# Patient Record
Sex: Female | Born: 1985 | Race: White | Hispanic: No | Marital: Married | Smoking: Never smoker
Health system: Southern US, Community
[De-identification: ages and names within clinical notes are randomized; demographics above are authoritative.]

## PROBLEM LIST (undated history)

## (undated) DIAGNOSIS — O24419 Gestational diabetes mellitus in pregnancy, unspecified control: Secondary | ICD-10-CM

## (undated) DIAGNOSIS — K219 Gastro-esophageal reflux disease without esophagitis: Secondary | ICD-10-CM

## (undated) HISTORY — PX: NO PAST SURGERIES: SHX2092

## (undated) HISTORY — DX: Gestational diabetes mellitus in pregnancy, unspecified control: O24.419

## (undated) HISTORY — DX: Gastro-esophageal reflux disease without esophagitis: K21.9

---

## 2007-03-03 ENCOUNTER — Emergency Department: Payer: Self-pay | Admitting: Emergency Medicine

## 2008-11-13 ENCOUNTER — Ambulatory Visit: Payer: Self-pay | Admitting: Family Medicine

## 2010-12-29 IMAGING — CT CT NECK WITH CONTRAST
2 series · 10 of 14 positions shown, 12 images · IV contrast (agent unspecified)
Comparison: none

REASON FOR EXAM: Left neck mass
CALL REPORT [id number]
COMMENTS:

PROCEDURE:     HIRCZE - HIRCZE NECK WITH CONTRAST  - November 13, 2008  [DATE]
RESULT:     The patient has a history of a mass.
TECHNIQUE: IV contrast enhanced neck CT is obtained.

[Series 2: soft tissue · axial · 0.43mm/px · z∈[+521,+716]mm · 8 of 85 slices shown, 10 images]
[im 10/85  soft-tissue]
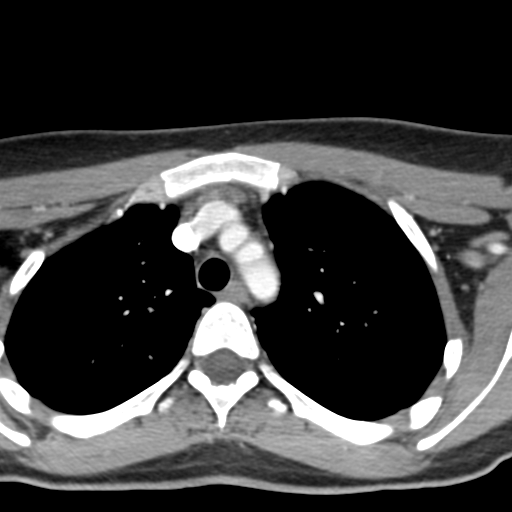
[im 10/85  bone]
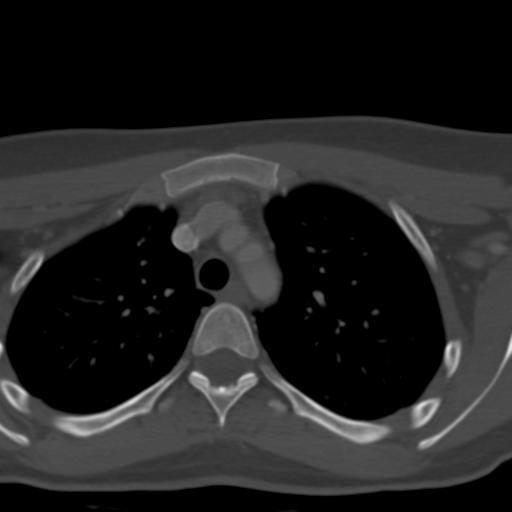
[im 19/85  bone]
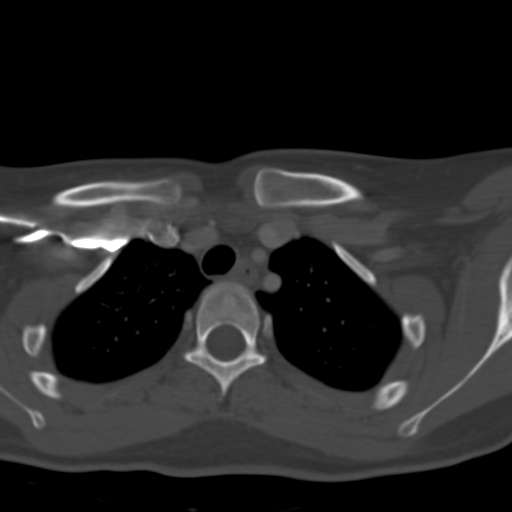
[im 29/85  bone]
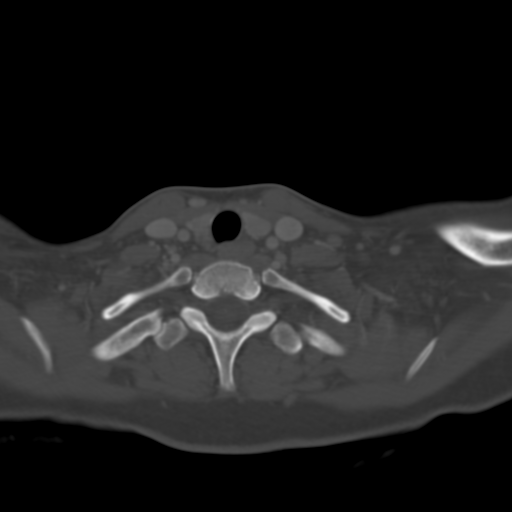
[im 38/85  bone]
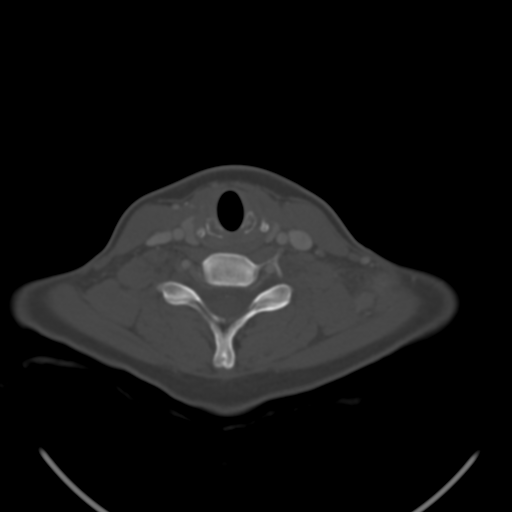
[im 47/85  soft-tissue]
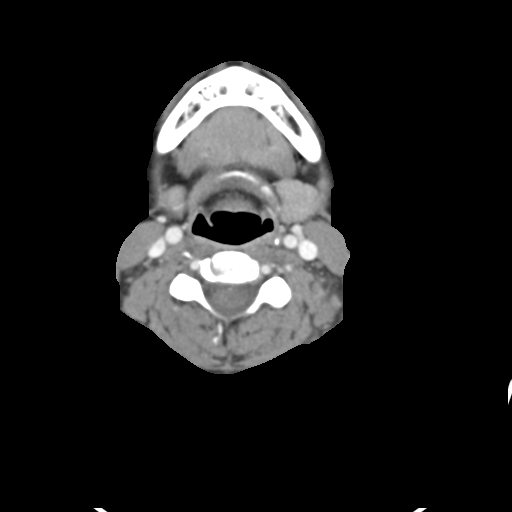
[im 47/85  bone]
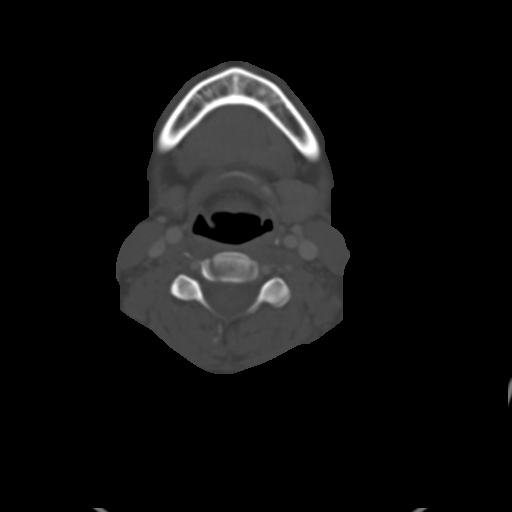
[im 57/85  bone]
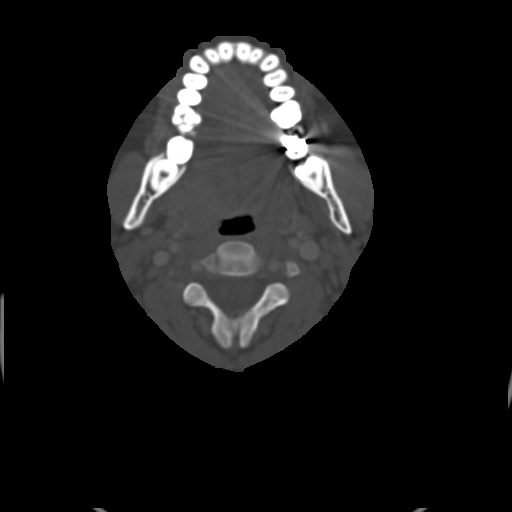
[im 66/85  bone]
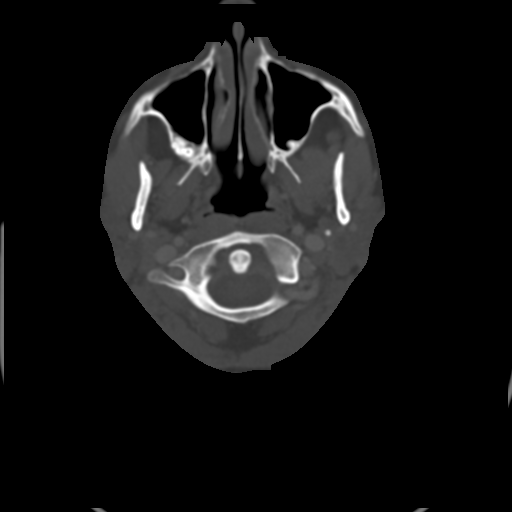
[im 75/85  bone]
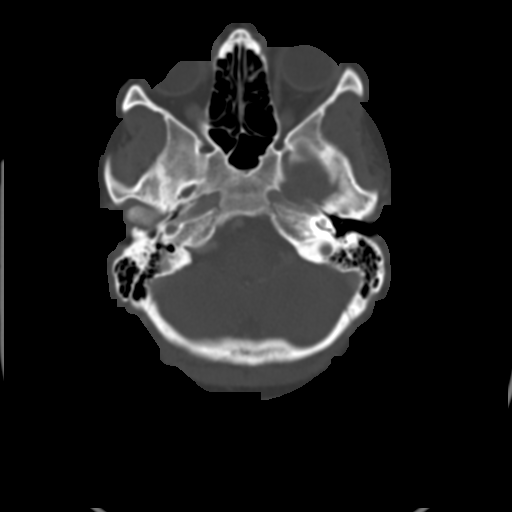

[Series 3: lung · axial · 0.43mm/px · z∈[+530,+569]mm · 2 of 39 slices shown]
[im 13/39  bone]
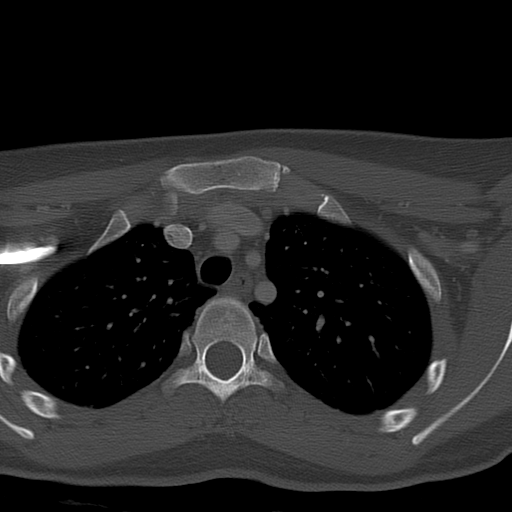
[im 26/39  bone]
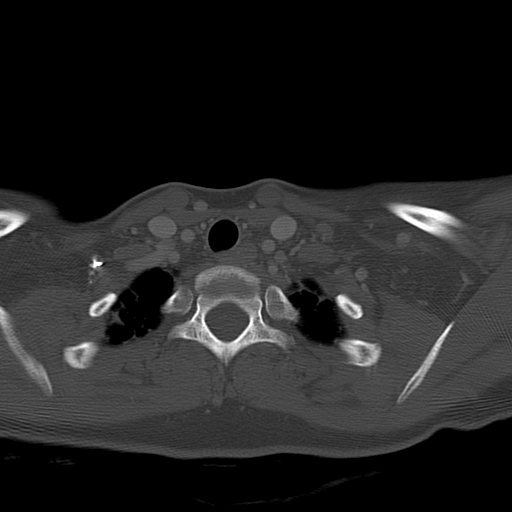

[10 of 14 positions shown; findings below may reference images not displayed]

FINDINGS: The tongue base is normal. The submandibular glands and parotid
glands are normal. The larynx and parapharyngeal spaces are normal. The
thyroid is normal. Noted in the posterior neck and subclavicular region are
enhancing nodular densities that measure up to 1.7 cm. At least three
lesions are noted. Lucency is noted within the central one. These could be
necrotic and enhancing lymph nodes and could represent inflammatory,
infectious or malignant lymph nodes. This could also represent a primary
malignancy. Adjacent shotty cervical lymphadenopathy is present.
Lymphadenopathy is also noted of the right jugular chain. Has this patient
had a cat scratch as cat scratch disease could present in this fashion?
IMPRESSION: Enhancing left posterior neck/supraclavicular lesion as
described above with differential diagnosis as above.

This report was phoned to the patient's physician at the time of the study.

## 2011-10-20 ENCOUNTER — Inpatient Hospital Stay: Payer: Self-pay | Admitting: Obstetrics and Gynecology

## 2014-02-12 ENCOUNTER — Inpatient Hospital Stay: Payer: Self-pay

## 2014-02-12 LAB — CBC WITH DIFFERENTIAL/PLATELET
Basophil #: 0.1 10*3/uL (ref 0.0–0.1)
Basophil %: 0.5 %
EOS PCT: 1.6 %
Eosinophil #: 0.2 10*3/uL (ref 0.0–0.7)
HCT: 33.3 % — AB (ref 35.0–47.0)
HGB: 10.9 g/dL — ABNORMAL LOW (ref 12.0–16.0)
LYMPHS ABS: 1.8 10*3/uL (ref 1.0–3.6)
Lymphocyte %: 14.5 %
MCH: 28.3 pg (ref 26.0–34.0)
MCHC: 32.6 g/dL (ref 32.0–36.0)
MCV: 87 fL (ref 80–100)
MONO ABS: 0.9 x10 3/mm (ref 0.2–0.9)
MONOS PCT: 7.6 %
Neutrophil #: 9.2 10*3/uL — ABNORMAL HIGH (ref 1.4–6.5)
Neutrophil %: 75.8 %
Platelet: 222 10*3/uL (ref 150–440)
RBC: 3.84 10*6/uL (ref 3.80–5.20)
RDW: 14.3 % (ref 11.5–14.5)
WBC: 12.1 10*3/uL — AB (ref 3.6–11.0)

## 2014-02-13 LAB — HEMATOCRIT: HCT: 32.1 % — AB (ref 35.0–47.0)

## 2015-03-16 NOTE — H&P (Signed)
L&D Evaluation:  History Expanded:  HPI 29 yo G2P1001 at 15w6dgestational age presents for elective Induction of labor.  Pregnancy uncomplicated.  She notes occasional contractions.  She denies leakage of fluid and vaginal bleeding.  She has noted good positive fetal movement.   Gravida 2   Term 1   PreTerm 0   Abortion 0   Living 1   Blood Type (Maternal) O positive   Group B Strep Results Maternal (Result >5wks must be treated as unknown) positive   Maternal HIV Negative   Maternal Syphilis Ab Nonreactive   Maternal Varicella Immune   Rubella Results (Maternal) nonimmune   [facility]Apr[facility]   Patient's Medical History No Chronic Illness   Patient's Surgical History none   Medications Pre Natal Vitamins   Allergies PCN, rash as a child   Social History none   Family History Non-Contributory   ROS:  ROS All systems were reviewed.  HEENT, CNS, GI, GU, Respiratory, CV, Renal and Musculoskeletal systems were found to be normal., unless noted in HPI   Exam:  Vital Signs afebrile, normotensive, all other vitals normal   General no apparent distress   Mental Status clear   Chest clear   Heart normal sinus rhythm   Abdomen gravid, non-tender   Estimated Fetal Weight 8.5 pounds   Fetal Position cephalic   Back no CVAT   Edema no edema   Pelvic no external lesions, 4/70/-2   Mebranes Intact   FHT normal rate with no decels   FHT Description 135/mod var/+accels/no decels   Ucx irregular, 3-4 q 10 min   Skin no lesions   Lymph no lymphadenopathy   Impression:  Impression 1) Intrauterine pregnancy at 458w6d2) Elective Induction of labor   Plan:  Comments 1) Labor: AROM now that is s/p Ancef 4 hours ago.  Clear fluid  2) Fetus - category I tracing  3) PNL O positive / ABSC negative / RNI - MMR postpartum / VZI / HIV neg / RPR NR / HBsAg neg / genetic screen declined / 1-hr OGTT 110 / GBS positive - clinda resistant. Did well with Ancef  last pregnancy. Will use again.   4) TDAP given 11/28/13, flu given 08/29/13  5) Disposition - home postpartum   Labs:  Lab Results: Routine Hem:  09-Apr-15 09:00   WBC (CBC)  12.1  RBC (CBC) 3.84  Hemoglobin (CBC)  10.9  Hematocrit (CBC)  33.3  Platelet Count (CBC) 222  MCV 87  MCH 28.3  MCHC 32.6  RDW 14.3  Neutrophil % 75.8  Lymphocyte % 14.5  Monocyte % 7.6  Eosinophil % 1.6  Basophil % 0.5  Neutrophil #  9.2  Lymphocyte # 1.8  Monocyte # 0.9  Eosinophil # 0.2  Basophil # 0.1 (Result(s) reported on 12 Feb 2014 at 09:44AM.)   Electronic Signatures: JaWill BonnetMD)  (Signed 09-Apr-15 12:40)  Authored: L&D Evaluation, Labs   Last Updated: 09-Apr-15 12:40 by JaWill BonnetMD)

## 2016-12-01 ENCOUNTER — Ambulatory Visit (INDEPENDENT_AMBULATORY_CARE_PROVIDER_SITE_OTHER): Payer: BLUE CROSS/BLUE SHIELD | Admitting: Family Medicine

## 2016-12-01 ENCOUNTER — Ambulatory Visit: Payer: Self-pay | Admitting: Family Medicine

## 2016-12-01 ENCOUNTER — Encounter: Payer: Self-pay | Admitting: Family Medicine

## 2016-12-01 VITALS — BP 118/82 | HR 87 | Temp 98.3°F | Ht 64.5 in | Wt 150.0 lb

## 2016-12-01 DIAGNOSIS — Z0000 Encounter for general adult medical examination without abnormal findings: Secondary | ICD-10-CM | POA: Diagnosis not present

## 2016-12-01 LAB — LIPID PANEL
Cholesterol: 168 mg/dL (ref 0–200)
HDL: 44.5 mg/dL (ref >39.00)
LDL CALC: 99 mg/dL (ref 0–99)
NONHDL: 123.5
TRIGLYCERIDES: 124 mg/dL (ref 0.0–149.0)
Total CHOL/HDL Ratio: 4
VLDL: 24.8 mg/dL (ref 0.0–40.0)

## 2016-12-01 LAB — COMPREHENSIVE METABOLIC PANEL WITH GFR
ALT: 16 U/L (ref 0–35)
AST: 19 U/L (ref 0–37)
Albumin: 4.4 g/dL (ref 3.5–5.2)
Alkaline Phosphatase: 97 U/L (ref 39–117)
BUN: 8 mg/dL (ref 6–23)
CO2: 32 meq/L (ref 19–32)
Calcium: 9.5 mg/dL (ref 8.4–10.5)
Chloride: 103 meq/L (ref 96–112)
Creatinine, Ser: 0.65 mg/dL (ref 0.40–1.20)
GFR: 111.81 mL/min
Glucose, Bld: 81 mg/dL (ref 70–99)
Potassium: 4.3 meq/L (ref 3.5–5.1)
Sodium: 140 meq/L (ref 135–145)
Total Bilirubin: 0.3 mg/dL (ref 0.2–1.2)
Total Protein: 7.3 g/dL (ref 6.0–8.3)

## 2016-12-01 LAB — CBC
HCT: 39.7 % (ref 36.0–46.0)
Hemoglobin: 13.4 g/dL (ref 12.0–15.0)
MCHC: 33.8 g/dL (ref 30.0–36.0)
MCV: 88.9 fl (ref 78.0–100.0)
Platelets: 305 10*3/uL (ref 150.0–400.0)
RBC: 4.47 Mil/uL (ref 3.87–5.11)
RDW: 12.8 % (ref 11.5–15.5)
WBC: 6.6 10*3/uL (ref 4.0–10.5)

## 2016-12-01 LAB — TSH: TSH: 2.02 u[IU]/mL (ref 0.35–4.50)

## 2016-12-01 NOTE — Patient Instructions (Signed)

## 2016-12-01 NOTE — Progress Notes (Signed)
Pre visit review using our clinic review tool, if applicable. No additional management support is needed unless otherwise documented below in the visit note. 

## 2016-12-01 NOTE — Progress Notes (Signed)
   Subjective:  Patient ID: Sonya Oconnor, female    DOB: 05/04/1986  Age: 31 y.o. MRN: [id number]  CC: Annual exam, concern for hypothyroidism  HPI Sonya Oconnor is a 31 y.o. female presents to the clinic today for an annual exam. She has a concern about underlying hypothyroidism.  Preventative Healthcare  Pap smear: Up to date.  Immunizations  Tetanus - Up to date.   Flu - Up to date.   Labs: Requesting labs today.  Exercise: No regular exercise.   Alcohol use: No.  Smoking/tobacco use: No.  STD/HIV testing: Has had screening.  Regular dental exams: Yes.   Wears seat belt: yes.   PMH, Surgical Hx, Family Hx, Social History reviewed and updated as below.  Past Medical History:  Diagnosis Date  . GERD (gastroesophageal reflux disease)    Past Surgical History:  Procedure Laterality Date  . NO PAST SURGERIES     Family History  Problem Relation Age of Onset  . Hypertension Father   . Mental illness Father   . Colon cancer Maternal Grandmother   . Thyroid disease Mother   . Thyroid disease Paternal Grandmother    Social History  Substance Use Topics  . Smoking status: Never Smoker  . Smokeless tobacco: Never Used  . Alcohol use No    Review of Systems  Constitutional: Positive for fatigue.  Psychiatric/Behavioral:       Sadness, anxiety, stress.  All other systems reviewed and are negative.  Objective:   Today's Vitals: BP 118/82   Pulse 87   Temp 98.3 F (36.8 C) (Oral)   Ht 5' 4.5" (1.638 m)   Wt 150 lb (68 kg)   SpO2 99%   BMI 25.35 kg/m   Physical Exam  Constitutional: She is oriented to person, place, and time. She appears well-developed and well-nourished. No distress.  HENT:  Head: Normocephalic and atraumatic.  Nose: Nose normal.  Mouth/Throat: Oropharynx is clear and moist. No oropharyngeal exudate.  Normal TM's bilaterally.   Eyes: Conjunctivae are normal. No scleral icterus.  Neck: Neck supple.  Cardiovascular:  Normal rate and regular rhythm.   No murmur heard. Pulmonary/Chest: Effort normal and breath sounds normal. She has no wheezes. She has no rales.  Abdominal: Soft. She exhibits no distension. There is no tenderness. There is no rebound and no guarding.  Musculoskeletal: Normal range of motion. She exhibits no edema.  Lymphadenopathy:    She has no cervical adenopathy.  Neurological: She is alert and oriented to person, place, and time.  Skin: Skin is warm and dry. No rash noted.  Psychiatric: She has a normal mood and affect.  Vitals reviewed.  Assessment & Plan:   Problem List Items Addressed This Visit    Annual physical exam - Primary    Preventative health care up to date.  Concerned about hypothyroidism given fatigue and family history. Labs today.      Relevant Orders   CBC   Comprehensive metabolic panel   Lipid panel   TSH     Follow-up: Return in about 1 year (around 12/01/2017).  Everlene OtherJayce Shanyiah Conde DO [facility]    ]

## 2016-12-01 NOTE — Assessment & Plan Note (Addendum)
Preventative health care up to date.  Concerned about hypothyroidism given fatigue and family history. Labs today.

## 2019-11-21 ENCOUNTER — Ambulatory Visit (INDEPENDENT_AMBULATORY_CARE_PROVIDER_SITE_OTHER): Payer: Self-pay | Admitting: Obstetrics and Gynecology

## 2019-11-21 ENCOUNTER — Encounter: Payer: Self-pay | Admitting: Obstetrics and Gynecology

## 2019-11-21 ENCOUNTER — Other Ambulatory Visit: Payer: Self-pay

## 2019-11-21 VITALS — BP 118/74 | Ht 64.0 in | Wt 151.0 lb

## 2019-11-21 DIAGNOSIS — Z30011 Encounter for initial prescription of contraceptive pills: Secondary | ICD-10-CM

## 2019-11-21 DIAGNOSIS — Z30432 Encounter for removal of intrauterine contraceptive device: Secondary | ICD-10-CM

## 2019-11-21 MED ORDER — NORGESTIMATE-ETH ESTRADIOL 0.25-35 MG-MCG PO TABS: 1.0000 | ORAL_TABLET | Freq: Every day | ORAL | 4 refills | Status: DC

## 2019-11-21 NOTE — Progress Notes (Signed)
    IUD Removal  Patient identified, informed consent performed, consent signed.  Patient was in the dorsal lithotomy position, normal external genitalia was noted.  A speculum was placed in the patient's vagina, normal discharge was noted, no lesions. The cervix was visualized, no lesions, no abnormal discharge.  The strings of the IUD were grasped and pulled using ring forceps. The IUD was removed in its entirety. Patient tolerated the procedure well.    Patient will use combined OCPs for contraception.  She has no contraindications to estrogen. She does not smoke, she has no history of VTE, she does not smoke.  She has no liver issues. Routine preventative health maintenance measures emphasized.  Thomasene Mohair, MD, [facility], [facility] 11/21/2019 4:03 PM

## 2019-12-05 ENCOUNTER — Ambulatory Visit: Payer: Self-pay | Admitting: Obstetrics and Gynecology

## 2021-07-19 ENCOUNTER — Telehealth: Payer: Self-pay

## 2021-07-19 NOTE — Telephone Encounter (Signed)
Pt's hsb, Sonya Oconnor, calling; pt is having trouble eating and keeping food down.  [phone]  Adv vitamin B6 10-24mg  q8h, unisom 25mg  at HS and 12.5mg  in am; sea bands, nausea suckers, lemon ginger drops; try to eat something every 3h if only a saltine cracker; to be seen if doesn't keep any fluids down for 24hrs.

## 2021-08-04 ENCOUNTER — Encounter: Payer: BLUE CROSS/BLUE SHIELD | Admitting: Obstetrics and Gynecology

## 2021-08-12 ENCOUNTER — Ambulatory Visit (INDEPENDENT_AMBULATORY_CARE_PROVIDER_SITE_OTHER): Payer: Self-pay | Admitting: Advanced Practice Midwife

## 2021-08-12 ENCOUNTER — Other Ambulatory Visit (HOSPITAL_COMMUNITY)
Admission: RE | Admit: 2021-08-12 | Discharge: 2021-08-12 | Disposition: A | Discharge status: Home / Self Care | Payer: Medicaid Other | Source: Ambulatory Visit | Attending: Obstetrics and Gynecology | Admitting: Obstetrics and Gynecology

## 2021-08-12 ENCOUNTER — Other Ambulatory Visit: Payer: Self-pay

## 2021-08-12 ENCOUNTER — Encounter: Payer: Self-pay | Admitting: Advanced Practice Midwife

## 2021-08-12 VITALS — BP 120/80 | Wt 155.0 lb

## 2021-08-12 DIAGNOSIS — Z369 Encounter for antenatal screening, unspecified: Secondary | ICD-10-CM | POA: Insufficient documentation

## 2021-08-12 DIAGNOSIS — Z3687 Encounter for antenatal screening for uncertain dates: Secondary | ICD-10-CM | POA: Insufficient documentation

## 2021-08-12 DIAGNOSIS — Z3A00 Weeks of gestation of pregnancy not specified: Secondary | ICD-10-CM | POA: Diagnosis not present

## 2021-08-12 DIAGNOSIS — Z124 Encounter for screening for malignant neoplasm of cervix: Secondary | ICD-10-CM | POA: Insufficient documentation

## 2021-08-12 DIAGNOSIS — Z3481 Encounter for supervision of other normal pregnancy, first trimester: Secondary | ICD-10-CM | POA: Insufficient documentation

## 2021-08-12 DIAGNOSIS — Z3A12 12 weeks gestation of pregnancy: Secondary | ICD-10-CM

## 2021-08-12 DIAGNOSIS — Z3480 Encounter for supervision of other normal pregnancy, unspecified trimester: Secondary | ICD-10-CM

## 2021-08-12 DIAGNOSIS — Z113 Encounter for screening for infections with a predominantly sexual mode of transmission: Secondary | ICD-10-CM | POA: Insufficient documentation

## 2021-08-12 DIAGNOSIS — Z1159 Encounter for screening for other viral diseases: Secondary | ICD-10-CM

## 2021-08-12 LAB — POCT URINALYSIS DIPSTICK OB
Glucose, UA: NEGATIVE
POC,PROTEIN,UA: NEGATIVE

## 2021-08-12 LAB — POCT URINE PREGNANCY: Preg Test, Ur: POSITIVE — AB

## 2021-08-12 NOTE — Patient Instructions (Signed)

## 2021-08-13 ENCOUNTER — Encounter: Payer: Self-pay | Admitting: Advanced Practice Midwife

## 2021-08-13 DIAGNOSIS — O09529 Supervision of elderly multigravida, unspecified trimester: Secondary | ICD-10-CM | POA: Insufficient documentation

## 2021-08-13 NOTE — Progress Notes (Signed)
New Obstetric Patient H&P  Date of Service: 08/12/2021  Chief Complaint: "Desires prenatal care"   History of Present Illness: Patient is a 35 y.o. G9M2111 Not Hispanic or Latino female, presents with amenorrhea and positive home pregnancy test. Patient's last menstrual period was 05/17/2021. and based on her  LMP, her EDD is Estimated Date of Delivery: 02/21/22 and her EGA is [redacted]w[redacted]d. Cycles are 4 days, regular, and occur approximately every : 28 days. Her last pap smear was 7 years ago and was no abnormalities.    She had a urine pregnancy test which was positive 6 week(s)  ago. Her last menstrual period was normal and lasted for  4 day(s). Since her LMP she claims she has experienced breast tenderness, fatigue, nausea. She denies vaginal bleeding. Her past medical history is noncontributory. Her prior pregnancies are notable for  2 FT SVDs both males, 2012, 2015, 8#14oz and 8#15oz. 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  Since her LMP, she admits to the use of tobacco products  no She claims she has gained no pounds since the start of her pregnancy.  There are cats in the home in the home  no  She admits close contact with children on a regular basis  yes  She has had chicken pox in the past yes She has had Tuberculosis exposures, symptoms, or previously tested positive for TB   no Current or past history of domestic violence. no  Genetic Screening/Teratology Counseling: (Includes patient, baby's father, or anyone in either family with:)   1. Patient's age >/= 45 at [facility]  yes 2. Thalassemia ([place], [place], Mediterranean, or Asian background): MCV<80  no 3. Neural tube defect (meningomyelocele, spina bifida, anencephaly)  no 4. Congenital heart defect  no  5. Down syndrome  no 6. Tay-Sachs (Jewish, [place]  no 7. Canavan's Disease  no 8. Sickle cell disease or trait (African)  no  9. Hemophilia or other blood disorders  no  10. Muscular dystrophy  no  11. Cystic fibrosis  no  12. Huntington's  Chorea  no  13. Mental retardation/autism  no 14. Other inherited genetic or chromosomal disorder  no 15. Maternal metabolic disorder (DM, PKU, etc)  no 16. 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 Patient or FOB with a birth defect themselves FOB born with cleft palate 17. Recurrent pregnancy loss, or stillbirth  no  18. Any medications since LMP other than prenatal vitamins (include vitamins, supplements, OTC meds, drugs, alcohol)  no 19. Any other genetic/environmental exposure to discuss  no  Infection History:   1. Lives with someone with TB or TB exposed  no  2. Patient or partner has history of genital herpes  no 3. Rash or viral illness since LMP  no 4. History of STI (GC, CT, HPV, syphilis, HIV)  no 5. History of recent travel :  no  Other pertinent information:  no    Review of Systems:10 point review of systems negative unless otherwise noted in HPI  Past Medical History:  Patient Active Problem List   Diagnosis Date Noted   Advanced maternal age in multigravida 08/13/2021   Supervision of other normal pregnancy, antepartum 08/12/2021     Nursing Staff Provider  Office Location  [facility] Dating    Language  English Anatomy US    Flu Vaccine   Genetic Screen  NIPS:   TDaP vaccine    Hgb A1C or  GTT Early :  NA Third trimester :   Covid    LAB RESULTS   Rhogam   Blood Type     Feeding Plan Breast Antibody    Contraception  Rubella    Circumcision  RPR     Pediatrician   HBsAg     Support Person Husband John HIV    Prenatal Classes  Varicella     GBS  (For PCN allergy, check sensitivities)   BTL Consent     VBAC Consent  Pap  08/12/2021    Hgb Electro    Pelvis Tested  CF      SMA        Husband; hx cleft palate Son; hx cleft uvula     Past Surgical History:  Past Surgical History:  Procedure Laterality Date   NO PAST SURGERIES      Gynecologic History: Patient's last menstrual period was  05/17/2021.  Obstetric History: [id number]  Family History:  Family History  Problem Relation Age of Onset   Hypertension Father    Mental illness Father    Colon cancer Maternal Grandmother    Thyroid disease Mother    Thyroid disease Paternal Grandmother     Social History:  Social History   Socioeconomic History   Marital status: Married    Spouse name: Not on file   Number of children: Not on file   Years of education: Not on file   Highest education level: Not on file  Occupational History   Not on file  Tobacco Use   Smoking status: Never   Smokeless tobacco: Never  Substance and Sexual Activity   Alcohol use: No   Drug use: No   Sexual activity: Yes    Partners: Male    Birth control/protection: I.U.D.  Other Topics Concern   Not on file  Social History Narrative   Not on file   Social Determinants of Health   Financial Resource Strain: Not on file  Food Insecurity: Not on file  Transportation Needs: Not on file  Physical Activity: Not on file  Stress: Not on file  Social Connections: Not on file  Intimate Partner Violence: Not on file    Allergies:  Allergies  Allergen Reactions   Penicillins     Medications: Prior to Admission medications   Not on File    Physical Exam Vitals: Blood pressure 120/80, weight 155 lb (70.3 kg), last menstrual period 05/17/2021.  General: NAD HEENT: normocephalic, anicteric Thyroid: no enlargement, no palpable nodules Pulmonary: No increased work of breathing, CTAB Cardiovascular: RRR, distal pulses 2+ Abdomen: NABS, soft, non-tender, non-distended.  Umbilicus without lesions.  No hepatomegaly, splenomegaly or masses palpable. No evidence of hernia. FHTs 160s Genitourinary:  External: Normal external female genitalia.  Normal urethral meatus, normal  Bartholin's and Skene's glands.    Vagina: Normal vaginal mucosa, no evidence of prolapse.    Cervix: Grossly normal in appearance, no bleeding, no CMT  Uterus:   Non-enlarged, mobile, normal contour.    Adnexa: ovaries non-enlarged, no adnexal masses  Rectal: deferred Extremities: no edema, erythema, or tenderness Neurologic: Grossly intact Psychiatric: mood appropriate, affect full   The following were addressed during this visit:  Breastfeeding Education - Early initiation of breastfeeding    Comments: Keeps milk supply adequate, helps contract uterus and slow bleeding, and early milk is the perfect first food and is easy to digest.   - The importance of exclusive breastfeeding    Comments: Provides antibodies, Lower risk of breast and ovarian cancers,  and type-2 diabetes,Helps your body recover, Reduced chance of SIDS.   - Risks of giving your baby anything other than breast milk if you are breastfeeding    Comments: Make the baby less content with breastfeeds, may make my baby more susceptible to illness, and may reduce my milk supply.   - The importance of early skin-to-skin contact    Comments:  Keeps baby warm and secure, helps keep baby's blood sugar up and breathing steady, easier to bond and breastfeed, and helps calm baby.  - Rooming-in on a 24-hour basis    Comments: Easier to learn baby's feeding cues, easier to bond and get to know each other, and encourages milk production.   - Feeding on demand or baby-led feeding    Comments: Helps prevent breastfeeding complications, helps bring in good milk supply, prevents under or overfeeding, and helps baby feel content and satisfied   - Frequent feeding to help assure optimal milk production    Comments: Making a full supply of milk requires frequent removal of milk from breasts, infant will eat 8-12 times in 24 hours, if separated from infant use breast massage, hand expression and/ or pumping to remove milk from breasts.   - Effective positioning and attachment    Comments: Helps my baby to get enough breast milk, helps to produce an adequate milk supply, and helps prevent  nipple pain and damage   - Exclusive breastfeeding for the first 6 months    Comments: Builds a healthy milk supply and keeps it up, protects baby from sickness and disease, and breastmilk has everything your baby needs for the first 6 months.   Assessment: 35 y.o. G3P2002 at [redacted]w[redacted]d presenting to initiate prenatal care  Plan: 1) Avoid alcoholic beverages. 2) Patient encouraged not to smoke.  3) Discontinue the use of all non-medicinal drugs and chemicals.  4) Take prenatal vitamins daily.  5) Nutrition, food safety (fish, cheese advisories, and high nitrite foods) and exercise discussed. 6) Hospital and practice style discussed with cross coverage system.  7) Genetic Screening, such as with 1st Trimester Screening, cell free fetal DNA, AFP testing, and Ultrasound, as well as with amniocentesis and CVS as appropriate, is discussed with patient. At the conclusion of today's visit patient requested genetic testing 8) Patient is asked about travel to areas at risk for the Zika virus, and counseled to avoid travel and exposure to mosquitoes or sexual partners who may have themselves been exposed to the virus. Testing is discussed, and will be ordered as appropriate.  9) PAPtima, urine culture today 10) Return to clinic in 1 week for dating scan and rob 40) Labs including MaterniT 21 when [employer] policy is updated; other NOB labs future ordered   [name], CNM [facility] 08/13/2021, 4:58 PM

## 2021-08-15 LAB — URINE CULTURE: Organism ID, Bacteria: NO GROWTH

## 2021-08-17 LAB — CYTOLOGY - PAP
Chlamydia: NEGATIVE
Comment: NEGATIVE
Comment: NEGATIVE
Comment: NEGATIVE
Comment: NORMAL
Diagnosis: NEGATIVE
High risk HPV: NEGATIVE
Neisseria Gonorrhea: NEGATIVE
Trichomonas: NEGATIVE

## 2021-08-23 ENCOUNTER — Encounter: Payer: Self-pay | Admitting: Obstetrics & Gynecology

## 2021-08-30 ENCOUNTER — Ambulatory Visit (INDEPENDENT_AMBULATORY_CARE_PROVIDER_SITE_OTHER): Payer: Self-pay | Admitting: Obstetrics and Gynecology

## 2021-08-30 ENCOUNTER — Other Ambulatory Visit: Payer: Self-pay

## 2021-08-30 VITALS — BP 116/76 | Wt 156.0 lb

## 2021-08-30 DIAGNOSIS — Z363 Encounter for antenatal screening for malformations: Secondary | ICD-10-CM

## 2021-08-30 DIAGNOSIS — Z3689 Encounter for other specified antenatal screening: Secondary | ICD-10-CM

## 2021-08-30 DIAGNOSIS — Z3480 Encounter for supervision of other normal pregnancy, unspecified trimester: Secondary | ICD-10-CM

## 2021-08-30 DIAGNOSIS — O09521 Supervision of elderly multigravida, first trimester: Secondary | ICD-10-CM

## 2021-08-30 LAB — POCT URINALYSIS DIPSTICK OB
Glucose, UA: NEGATIVE
POC,PROTEIN,UA: NEGATIVE

## 2021-08-30 NOTE — Progress Notes (Signed)
    Routine Prenatal Care Visit  Subjective  Sonya Oconnor is a 35 y.o. G3P2002 at [redacted]w[redacted]d being seen today for ongoing prenatal care.  She is currently monitored for the following issues for this high-risk pregnancy and has Supervision of other normal pregnancy, antepartum and Advanced maternal age in multigravida on their problem list.  ----------------------------------------------------------------------------------- Patient reports no complaints.   Contractions: Not present. Vag. Bleeding: None.  Movement: Absent. Denies leaking of fluid.  ----------------------------------------------------------------------------------- The following portions of the patient's history were reviewed and updated as appropriate: allergies, current medications, past family history, past medical history, past social history, past surgical history and problem list. Problem list updated.   Objective  Last menstrual period 05/17/2021. Pregravid weight 155 lb (70.3 kg) Total Weight Gain 1 lb (0.454 kg) Urinalysis:      Fetal Status: Fetal Heart Rate (bpm): 145   Movement: Absent     General:  Alert, oriented and cooperative. Patient is in no acute distress.  Skin: Skin is warm and dry. No rash noted.   Cardiovascular: Normal heart rate noted  Respiratory: Normal respiratory effort, no problems with respiration noted  Abdomen: Soft, gravid, appropriate for gestational age. Pain/Pressure: Absent     Pelvic:  Cervical exam deferred        Extremities: Normal range of motion.     ental Status: Normal mood and affect. Normal behavior. Normal judgment and thought content.     Assessment   35 y.o. P8E4235 at [redacted]w[redacted]d by  02/21/2022, by Last Menstrual Period presenting for routine prenatal visit  Plan   pregnancy3  Problems (from 08/12/21 to present)     Problem Noted Resolved   Advanced maternal age in multigravida 08/13/2021 by [name], CNM No   Supervision of other normal pregnancy, antepartum  08/12/2021 by [name], CNM No   Overview Addendum 08/13/2021  4:53 PM by [name], CNM     Nursing Staff Provider  Office Location  [facility] Dating    Language  English Anatomy US    Flu Vaccine   Genetic Screen  NIPS:   TDaP vaccine    Hgb A1C or  GTT Early : NA Third trimester :   Covid    LAB RESULTS   Rhogam   Blood Type     Feeding Plan Breast Antibody    Contraception  Rubella    Circumcision  RPR     Pediatrician   HBsAg     Support Person Husband John HIV    Prenatal Classes  Varicella     GBS  (For PCN allergy, check sensitivities)   BTL Consent     VBAC Consent  Pap  08/12/2021    Hgb Electro    Pelvis Tested  CF      SMA        Husband; hx cleft palate Son; hx cleft uvula           Gestational age appropriate obstetric precautions including but not limited to vaginal bleeding, contractions, leaking of fluid and fetal movement were reviewed in detail with the patient.    - Dating scan today S=D - anatomy scan ordered - will wait on labs until next month once maternity coverage active  Return in about 4 weeks (around 09/27/2021), or ROB, for ROB.  Vena Austria, MD, Evern Core [facility], [facility] 08/30/2021, 1:31 PM

## 2021-08-30 NOTE — Progress Notes (Signed)
ROB - dating scan, no concerns.US 1 

## 2021-08-30 NOTE — Progress Notes (Signed)
Dating scan  Singleton viable IUP with CRL of [redacted]w[redacted]d or 8.34cm ( 8.18cm, 8.35cm, and 8.50cm), FL 1.40cm c.w [redacted]w[redacted]d.  This given an ultrasound EDD of 02/27/2022 which is consistent with LMP dating of 02/21/2022.  FHT 148BPM YS not visualized ROV normal LOV normal Placenta posterior No evidence of uterine fibroids, no free fluid.  There is a viable singleton gestation.  The fetal biometry correlates with established dating. Detailed evaluation of the fetal anatomy is precluded by early gestational age.  It must be noted that a normal ultrasound particular at this early gestational age is unable to rule out fetal aneuploidy, risk of first trimester miscarriage, or anatomic birth defects.  Vena [place], MD, Evern Core [facility], [facility] 08/30/2021, 1:30 PM

## 2021-09-27 ENCOUNTER — Encounter: Payer: Self-pay | Admitting: Obstetrics

## 2021-10-03 ENCOUNTER — Ambulatory Visit: Payer: Self-pay

## 2021-10-03 ENCOUNTER — Ambulatory Visit
Admission: RE | Admit: 2021-10-03 | Discharge: 2021-10-03 | Disposition: A | Discharge status: Home / Self Care | Payer: Medicaid Other | Source: Ambulatory Visit | Attending: Obstetrics and Gynecology | Admitting: Obstetrics and Gynecology

## 2021-10-03 ENCOUNTER — Other Ambulatory Visit: Payer: Self-pay

## 2021-10-03 DIAGNOSIS — O09522 Supervision of elderly multigravida, second trimester: Secondary | ICD-10-CM | POA: Insufficient documentation

## 2021-10-03 DIAGNOSIS — O09521 Supervision of elderly multigravida, first trimester: Secondary | ICD-10-CM

## 2021-10-03 DIAGNOSIS — Z3480 Encounter for supervision of other normal pregnancy, unspecified trimester: Secondary | ICD-10-CM

## 2021-10-03 DIAGNOSIS — Z363 Encounter for antenatal screening for malformations: Secondary | ICD-10-CM | POA: Diagnosis not present

## 2021-10-03 DIAGNOSIS — Z3A19 19 weeks gestation of pregnancy: Secondary | ICD-10-CM | POA: Insufficient documentation

## 2021-10-05 ENCOUNTER — Encounter: Payer: Self-pay | Admitting: Obstetrics

## 2021-10-05 ENCOUNTER — Other Ambulatory Visit: Payer: Self-pay

## 2021-10-05 ENCOUNTER — Ambulatory Visit (INDEPENDENT_AMBULATORY_CARE_PROVIDER_SITE_OTHER): Payer: Self-pay | Admitting: Obstetrics

## 2021-10-05 VITALS — BP 118/70 | Ht 64.0 in | Wt 161.6 lb

## 2021-10-05 DIAGNOSIS — Z3480 Encounter for supervision of other normal pregnancy, unspecified trimester: Secondary | ICD-10-CM

## 2021-10-05 DIAGNOSIS — Z369 Encounter for antenatal screening, unspecified: Secondary | ICD-10-CM

## 2021-10-05 DIAGNOSIS — Z1159 Encounter for screening for other viral diseases: Secondary | ICD-10-CM

## 2021-10-05 DIAGNOSIS — Z3A20 20 weeks gestation of pregnancy: Secondary | ICD-10-CM

## 2021-10-05 DIAGNOSIS — Z3481 Encounter for supervision of other normal pregnancy, first trimester: Secondary | ICD-10-CM

## 2021-10-05 DIAGNOSIS — Z113 Encounter for screening for infections with a predominantly sexual mode of transmission: Secondary | ICD-10-CM

## 2021-10-05 NOTE — Progress Notes (Signed)
  Routine Prenatal Care Visit  Subjective  Sonya Oconnor is a 35 y.o. G3P2002 at [redacted]w[redacted]d being seen today for ongoing prenatal care.  She is currently monitored for the following issues for this low-risk pregnancy and has Supervision of other normal pregnancy, antepartum and Advanced maternal age in multigravida on their problem list.  ----------------------------------------------------------------------------------- Patient reports no complaints.  She now has insurance, and will have her prenatal labs drawn today. She is having a boy. Contractions: Not present. Vag. Bleeding: None.  Movement: Present. Leaking Fluid denies.  ----------------------------------------------------------------------------------- The following portions of the patient's history were reviewed and updated as appropriate: allergies, current medications, past family history, past medical history, past social history, past surgical history and problem list. Problem list updated.  Objective  Blood pressure 118/70, height 5\' 4"  (1.626 m), weight 161 lb 9.6 oz (73.3 kg), last menstrual period 05/17/2021. Pregravid weight 155 lb (70.3 kg) Total Weight Gain 6 lb 9.6 oz (2.994 kg) Urinalysis: Urine Protein    Urine Glucose    Fetal Status:     Movement: Present     General:  Alert, oriented and cooperative. Patient is in no acute distress.  Skin: Skin is warm and dry. No rash noted.   Cardiovascular: Normal heart rate noted  Respiratory: Normal respiratory effort, no problems with respiration noted  Abdomen: Soft, gravid, appropriate for gestational age. Pain/Pressure: Absent     Pelvic:  Cervical exam deferred        Extremities: Normal range of motion.     Mental Status: Normal mood and affect. Normal behavior. Normal judgment and thought content.   Assessment   35 y.o. G3P2002 at [redacted]w[redacted]d by  02/21/2022, by Last Menstrual Period presenting for routine prenatal visit  Plan   pregnancy3  Problems (from 08/12/21 to  present)    Problem Noted Resolved   Advanced maternal age in multigravida 08/13/2021 by 10/13/2021, CNM No   Supervision of other normal pregnancy, antepartum 08/12/2021 by 10/12/2021, CNM No   Overview Addendum 08/13/2021  4:53 PM by 10/13/2021, CNM     Nursing Staff Provider  Office Location  [facility] Dating    Language  English Anatomy [name]    Flu Vaccine   Genetic Screen  NIPS:   TDaP vaccine    Hgb A1C or  GTT Early : NA Third trimester :   Covid    LAB RESULTS   Rhogam   Blood Type     Feeding Plan Breast Antibody    Contraception  Rubella    Circumcision  RPR     Pediatrician   HBsAg     Support Person Husband John HIV    Prenatal Classes  Varicella     GBS  (For PCN allergy, check sensitivities)   BTL Consent     VBAC Consent  Pap  08/12/2021    Hgb Electro    Pelvis Tested  CF      SMA         Husband; hx cleft palate Son; hx cleft uvula          Preterm labor symptoms and general obstetric precautions including but not limited.    Return in about 2 weeks (around 10/19/2021).  A repeat scan is ordered to better view the spine. 10/21/2021, CNM  10/05/2021 4:14 PM

## 2021-11-06 NOTE — L&D Delivery Note (Addendum)
Delivery Note ? ?First Stage: ?Labor onset: 0630 ?Augmentation : AROM and pitocin ?Analgesia /Anesthesia intrapartum: epidural ?AROM at 0626 ? ?Second Stage: ?Complete dilation at 1000 ?Onset of pushing at 1006 ?FHR second stage Cat II, recurrent variable decels ? ?Delivery of a viable female infant 02/23/2022 at 1011 by Donato Schultz, CNM ?delivery of fetal head in OA position with restitution to LOT. ?No nuchal cord;  Anterior then posterior shoulders delivered easily with gentle downward traction. Baby placed on mom's chest, and attended to by peds.  ?Cord double clamped after cessation of pulsation, cut by FOB ?Cord blood sample collected  ? ?Third Stage: ?Placenta delivered Tomasa Blase intact with 3 VC @ 1019 ?Placenta disposition: discarded ?Uterine tone firm / bleeding light ? ?no laceration identified  ?Anesthesia for repair: n/a ?Repair none needed ?Est. Blood Loss (mL): 250 ? ?Complications: none ? ?Mom to postpartum.  Baby to Couplet care / Skin to Skin. ? ?Newborn: ?Birth Weight: 8lb 9oz ?Apgar Scores: 8, 9 ?Feeding planned: breast ? ? ? ?

## 2021-11-24 DIAGNOSIS — Z3482 Encounter for supervision of other normal pregnancy, second trimester: Secondary | ICD-10-CM | POA: Diagnosis not present

## 2021-11-24 DIAGNOSIS — O09523 Supervision of elderly multigravida, third trimester: Secondary | ICD-10-CM | POA: Insufficient documentation

## 2021-11-24 DIAGNOSIS — O09522 Supervision of elderly multigravida, second trimester: Secondary | ICD-10-CM | POA: Diagnosis not present

## 2021-12-02 DIAGNOSIS — Z3482 Encounter for supervision of other normal pregnancy, second trimester: Secondary | ICD-10-CM | POA: Diagnosis not present

## 2021-12-02 LAB — OB RESULTS CONSOLE HIV ANTIBODY (ROUTINE TESTING): HIV: NONREACTIVE

## 2021-12-02 LAB — OB RESULTS CONSOLE GC/CHLAMYDIA: Gonorrhea: NEGATIVE

## 2021-12-02 LAB — OB RESULTS CONSOLE VARICELLA ZOSTER ANTIBODY, IGG: Varicella: IMMUNE

## 2021-12-06 ENCOUNTER — Other Ambulatory Visit: Payer: Self-pay | Admitting: Certified Nurse Midwife

## 2021-12-07 ENCOUNTER — Ambulatory Visit: Payer: Self-pay

## 2021-12-07 DIAGNOSIS — Z419 Encounter for procedure for purposes other than remedying health state, unspecified: Secondary | ICD-10-CM | POA: Diagnosis not present

## 2021-12-08 DIAGNOSIS — Z0374 Encounter for suspected problem with fetal growth ruled out: Secondary | ICD-10-CM | POA: Diagnosis not present

## 2021-12-09 DIAGNOSIS — R7309 Other abnormal glucose: Secondary | ICD-10-CM | POA: Diagnosis not present

## 2021-12-15 ENCOUNTER — Other Ambulatory Visit: Payer: Self-pay

## 2021-12-15 ENCOUNTER — Encounter: Payer: 59 | Attending: Certified Nurse Midwife | Admitting: *Deleted

## 2021-12-15 ENCOUNTER — Encounter: Payer: Self-pay | Admitting: *Deleted

## 2021-12-15 VITALS — BP 106/68 | Ht 63.0 in | Wt 166.8 lb

## 2021-12-15 DIAGNOSIS — O09523 Supervision of elderly multigravida, third trimester: Secondary | ICD-10-CM | POA: Diagnosis not present

## 2021-12-15 DIAGNOSIS — Z3A29 29 weeks gestation of pregnancy: Secondary | ICD-10-CM | POA: Insufficient documentation

## 2021-12-15 DIAGNOSIS — O24419 Gestational diabetes mellitus in pregnancy, unspecified control: Secondary | ICD-10-CM | POA: Insufficient documentation

## 2021-12-15 DIAGNOSIS — O24410 Gestational diabetes mellitus in pregnancy, diet controlled: Secondary | ICD-10-CM

## 2021-12-15 NOTE — Progress Notes (Signed)
Diabetes Self-Management Education  Visit Type: First/Initial  Appt. Start Time: 0830 Appt. End Time: 1000  12/15/2021  [name], identified by name and date of birth, is a 36 y.o. female with a diagnosis of Diabetes: Gestational Diabetes.   ASSESSMENT  Blood pressure 106/68, height 5\' 3"  (1.6 m), weight 166 lb 12.8 oz (75.7 kg), last menstrual period 05/17/2021, estimated date of delivery 02/21/2022 Body mass index is 29.55 kg/m.   Diabetes Self-Management Education - 12/15/21 0929       Visit Information   Visit Type First/Initial      Initial Visit   Diabetes Type Gestational Diabetes    Are you currently following a meal plan? No    Are you taking your medications as prescribed? Yes    Date Diagnosed "few days'      Health Coping   How would you rate your overall health? Good      Psychosocial Assessment   Patient Belief/Attitude about Diabetes Other (comment)   "a little worried"   Self-care barriers None    Self-management support Doctor's office;Family    Patient Concerns Nutrition/Meal planning;Healthy Lifestyle;Glycemic Control    Special Needs None    Preferred Learning Style Hands on;Visual    Learning Readiness Ready    How often do you need to have someone help you when you read instructions, pamphlets, or other written materials from your doctor or pharmacy? 1 - Never    What is the last grade level you completed in school? college      Pre-Education Assessment   Patient understands the diabetes disease and treatment process. Needs Instruction    Patient understands incorporating nutritional management into lifestyle. Needs Instruction    Patient undertands incorporating physical activity into lifestyle. Needs Instruction    Patient understands using medications safely. Needs Instruction    Patient understands monitoring blood glucose, interpreting and using results Needs Instruction    Patient understands prevention, detection, and treatment of  acute complications. Needs Instruction    Patient understands prevention, detection, and treatment of chronic complications. Needs Instruction    Patient understands how to develop strategies to address psychosocial issues. Needs Instruction    Patient understands how to develop strategies to promote health/change behavior. Needs Instruction      Complications   How often do you check your blood sugar? 0 times/day (not testing)   Provided Accu-Chek Guide Me meter and instructed on use. BG upon return demonstration was 119 mg/dL at 9:50 am - 2 hrs pp. Pt had 4 chicken mini's and 1/2 and 1/2 tea for breakfast.   Have you had a dilated eye exam in the past 12 months? No    Have you had a dental exam in the past 12 months? Yes    Are you checking your feet? No      Dietary Intake   Breakfast only eats breakfast 2 x week - bacon, eggs, fried potatoes    Snack (morning) reports 2-3 snacks/day - nuts, cookies, fruit (strawberries, pineapples, grapes, bananas)    Lunch salad every 2-3 days; [employer] chicken and fries, [employer] hamburger and fries    Dinner chicken and beef; potatoes, corn, beans, rice, pasta, green beans; salads with lettuce, tomatoes, cuccumbers, cheese, beets, olives, peppers    Beverage(s) water, soda, 1/2 and 1/2 tea      Exercise   Exercise Type ADL's      Patient Education   Previous Diabetes Education No    Disease state  Definition of  diabetes, type 1 and 2, and the diagnosis of diabetes;Factors that contribute to the development of diabetes    Nutrition management  Role of diet in the treatment of diabetes and the relationship between the three main macronutrients and blood glucose level;Food label reading, portion sizes and measuring food.;Reviewed blood glucose goals for pre and post meals and how to evaluate the patients' food intake on their blood glucose level.;Information on hints to eating out and maintain blood glucose control.    Physical activity and exercise   Role of exercise on diabetes management, blood pressure control and cardiac health.    Medications Other (comment)   Limited use of oral medications during pregnancy and potential for insulin   Monitoring Taught/evaluated SMBG meter.;Purpose and frequency of SMBG.;Taught/discussed recording of test results and interpretation of SMBG.;Identified appropriate SMBG and/or A1C goals.;Ketone testing, when, how.    Chronic complications Relationship between chronic complications and blood glucose control    Psychosocial adjustment Identified and addressed patients feelings and concerns about diabetes    Preconception care Pregnancy and GDM  Role of pre-pregnancy blood glucose control on the development of the fetus;Reviewed with patient blood glucose goals with pregnancy;Role of family planning for patients with diabetes      Individualized Goals (developed by patient)   Reducing Risk Other (comment)   improve blood sugars, lead a healthier lifestyle     Outcomes   Expected Outcomes Demonstrated interest in learning. Expect positive outcomes        Individualized Plan for Diabetes Self-Management Training:   Learning Objective:  Patient will have a greater understanding of diabetes self-management. Patient education plan is to attend individual and/or group sessions per assessed needs and concerns.   Plan:   Patient Instructions  Read booklet on Gestational Diabetes Follow Gestational Meal Planning Guidelines Don't skip meals - eat at least 1 protein and 1 carbohydrate serving Limit fried foods and desserts/sweets Avoid sugar sweetened drinks (soda, tea) Complete a 3 Day Food Record and bring to next appointment Check blood sugars 4 x day - before breakfast and 2 hrs after every meal and record  Bring blood sugar log to all appointments Call MD for prescription for meter strips and lancets Strips   Accu-Chek Guide  Lancets   Accu-Chek Softclix Purchase urine ketone strips if instructed by  MD and check urine ketones every am:  If + increase bedtime snack to 1 protein and 2 carbohydrate servings Walk 20-30 minutes at least 5 x week if permitted by MD  Expected Outcomes:  Demonstrated interest in learning. Expect positive outcomes  Education material provided:  Gestational Booklet Gestational Meal Planning Guidelines Simple Meal Plan Viewed Gestational Diabetes Video Meter = Accu-Chek Guide Me 3 Day Food Record Goals for a Healthy Pregnancy   If problems or questions, patient to contact team via:   Johny Drilling, RN, [place], [place] [phone]  Future DSME appointment: December 22, 2021 with the dietitian

## 2021-12-15 NOTE — Patient Instructions (Signed)
Read booklet on Gestational Diabetes Follow Gestational Meal Planning Guidelines Don't skip meals - eat at least 1 protein and 1 carbohydrate serving Limit fried foods and desserts/sweets Avoid sugar sweetened drinks (soda, tea) Complete a 3 Day Food Record and bring to next appointment Check blood sugars 4 x day - before breakfast and 2 hrs after every meal and record  Bring blood sugar log to all appointments Call MD for prescription for meter strips and lancets Strips   Accu-Chek Guide  Lancets   Accu-Chek Softclix Purchase urine ketone strips if instructed by MD and check urine ketones every am:  If + increase bedtime snack to 1 protein and 2 carbohydrate servings Walk 20-30 minutes at least 5 x week if permitted by MD

## 2021-12-22 ENCOUNTER — Other Ambulatory Visit: Payer: Self-pay

## 2021-12-22 ENCOUNTER — Encounter: Payer: Self-pay | Admitting: Dietician

## 2021-12-22 ENCOUNTER — Encounter: Payer: 59 | Admitting: Dietician

## 2021-12-22 VITALS — BP 110/70 | Ht 63.0 in | Wt 165.4 lb

## 2021-12-22 DIAGNOSIS — O24410 Gestational diabetes mellitus in pregnancy, diet controlled: Secondary | ICD-10-CM

## 2021-12-22 DIAGNOSIS — Z3A29 29 weeks gestation of pregnancy: Secondary | ICD-10-CM | POA: Diagnosis not present

## 2021-12-22 DIAGNOSIS — O09523 Supervision of elderly multigravida, third trimester: Secondary | ICD-10-CM | POA: Diagnosis not present

## 2021-12-22 DIAGNOSIS — O24419 Gestational diabetes mellitus in pregnancy, unspecified control: Secondary | ICD-10-CM | POA: Diagnosis not present

## 2021-12-22 NOTE — Progress Notes (Signed)
Patient's BG record indicates fasting BGs ranging 85-91, and post-meal BGs ranging 83-117 Patient's food diary indicates meals and snacks at regular intervals, well-balanced meals and controlled carbohydrate intake.  Provided basic balanced meal plan, and wrote individualized menus based on patient's food preferences. Discussed inclusion of fruit with meals and up to 45g carb with each meal.  Instructed patient on food safety, including avoidance of Listeriosis, and limiting mercury from fish. Instructed on appropriate treatment for low BGs; patient has not yet had any episodes of hypoglycemic symptoms/ results. Discussed importance of maintaining healthy lifestyle habits to reduce risk of Type 2 DM as well as Gestational DM with any future pregnancies. Advised patient to use any remaining testing supplies to test some BGs after delivery, and to have BG tested ideally annually, as well as prior to attempting future pregnancies.

## 2021-12-23 DIAGNOSIS — O24410 Gestational diabetes mellitus in pregnancy, diet controlled: Secondary | ICD-10-CM | POA: Insufficient documentation

## 2022-01-04 DIAGNOSIS — Z419 Encounter for procedure for purposes other than remedying health state, unspecified: Secondary | ICD-10-CM | POA: Diagnosis not present

## 2022-01-27 DIAGNOSIS — Z3483 Encounter for supervision of other normal pregnancy, third trimester: Secondary | ICD-10-CM | POA: Diagnosis not present

## 2022-01-27 DIAGNOSIS — Z114 Encounter for screening for human immunodeficiency virus [HIV]: Secondary | ICD-10-CM | POA: Diagnosis not present

## 2022-01-27 DIAGNOSIS — Z113 Encounter for screening for infections with a predominantly sexual mode of transmission: Secondary | ICD-10-CM | POA: Diagnosis not present

## 2022-01-27 LAB — OB RESULTS CONSOLE RPR: RPR: NONREACTIVE

## 2022-02-03 DIAGNOSIS — O09523 Supervision of elderly multigravida, third trimester: Secondary | ICD-10-CM | POA: Diagnosis not present

## 2022-02-04 DIAGNOSIS — Z419 Encounter for procedure for purposes other than remedying health state, unspecified: Secondary | ICD-10-CM | POA: Diagnosis not present

## 2022-02-07 DIAGNOSIS — O09523 Supervision of elderly multigravida, third trimester: Secondary | ICD-10-CM | POA: Diagnosis not present

## 2022-02-22 ENCOUNTER — Encounter: Payer: Self-pay | Admitting: Obstetrics and Gynecology

## 2022-02-22 ENCOUNTER — Other Ambulatory Visit: Payer: Self-pay

## 2022-02-22 ENCOUNTER — Inpatient Hospital Stay
Admission: EM | Admit: 2022-02-22 | Discharge: 2022-02-25 | DRG: 807 | Disposition: A | Discharge status: Home / Self Care | Payer: 59 | Attending: Obstetrics | Admitting: Obstetrics

## 2022-02-22 DIAGNOSIS — Z880 Allergy status to penicillin: Secondary | ICD-10-CM

## 2022-02-22 DIAGNOSIS — O99824 Streptococcus B carrier state complicating childbirth: Secondary | ICD-10-CM | POA: Diagnosis present

## 2022-02-22 DIAGNOSIS — O9081 Anemia of the puerperium: Secondary | ICD-10-CM | POA: Diagnosis not present

## 2022-02-22 DIAGNOSIS — O480 Post-term pregnancy: Secondary | ICD-10-CM | POA: Diagnosis not present

## 2022-02-22 DIAGNOSIS — D62 Acute posthemorrhagic anemia: Secondary | ICD-10-CM | POA: Diagnosis not present

## 2022-02-22 DIAGNOSIS — O24420 Gestational diabetes mellitus in childbirth, diet controlled: Secondary | ICD-10-CM | POA: Diagnosis not present

## 2022-02-22 DIAGNOSIS — Z412 Encounter for routine and ritual male circumcision: Secondary | ICD-10-CM | POA: Diagnosis not present

## 2022-02-22 DIAGNOSIS — Z3A40 40 weeks gestation of pregnancy: Secondary | ICD-10-CM | POA: Diagnosis not present

## 2022-02-22 DIAGNOSIS — O479 False labor, unspecified: Principal | ICD-10-CM | POA: Diagnosis present

## 2022-02-22 LAB — CBC
HCT: 36.5 % (ref 36.0–46.0)
Hemoglobin: 12.4 g/dL (ref 12.0–15.0)
MCH: 30.1 pg (ref 26.0–34.0)
MCHC: 34 g/dL (ref 30.0–36.0)
MCV: 88.6 fL (ref 80.0–100.0)
Platelets: 205 10*3/uL (ref 150–400)
RBC: 4.12 MIL/uL (ref 3.87–5.11)
RDW: 13.2 % (ref 11.5–15.5)
WBC: 8.1 10*3/uL (ref 4.0–10.5)
nRBC: 0 % (ref 0.0–0.2)

## 2022-02-22 LAB — COMPREHENSIVE METABOLIC PANEL
ALT: 11 U/L (ref 0–44)
AST: 23 U/L (ref 15–41)
Albumin: 2.8 g/dL — ABNORMAL LOW (ref 3.5–5.0)
Alkaline Phosphatase: 239 U/L — ABNORMAL HIGH (ref 38–126)
Anion gap: 11 (ref 5–15)
BUN: 11 mg/dL (ref 6–20)
CO2: 20 mmol/L — ABNORMAL LOW (ref 22–32)
Calcium: 8.8 mg/dL — ABNORMAL LOW (ref 8.9–10.3)
Chloride: 103 mmol/L (ref 98–111)
Creatinine, Ser: 0.59 mg/dL (ref 0.44–1.00)
GFR, Estimated: >60 mL/min (ref >60)
Glucose, Bld: 98 mg/dL (ref 70–99)
Potassium: 4.1 mmol/L (ref 3.5–5.1)
Sodium: 134 mmol/L — ABNORMAL LOW (ref 135–145)
Total Bilirubin: 0.9 mg/dL (ref 0.3–1.2)
Total Protein: 6.3 g/dL — ABNORMAL LOW (ref 6.5–8.1)

## 2022-02-22 LAB — OB RESULTS CONSOLE HEPATITIS B SURFACE ANTIGEN: Hepatitis B Surface Ag: NEGATIVE

## 2022-02-22 LAB — PROTEIN / CREATININE RATIO, URINE
Creatinine, Urine: 93 mg/dL
Protein Creatinine Ratio: 0.16 mg/mg{Cre} — ABNORMAL HIGH (ref 0.00–0.15)
Total Protein, Urine: 15 mg/dL

## 2022-02-22 MED ORDER — AMMONIA AROMATIC IN INHA
RESPIRATORY_TRACT | Status: AC
  Filled 2022-02-22: qty 10

## 2022-02-22 MED ORDER — LACTATED RINGERS IV SOLN: INTRAVENOUS | Status: DC

## 2022-02-22 MED ORDER — MISOPROSTOL 200 MCG PO TABS
ORAL_TABLET | ORAL | Status: AC
  Filled 2022-02-22: qty 4

## 2022-02-22 MED ORDER — LIDOCAINE HCL (PF) 1 % IJ SOLN
30.0000 mL | INTRAMUSCULAR | Status: DC | PRN
  Filled 2022-02-22: qty 30

## 2022-02-22 MED ORDER — OXYTOCIN 10 UNIT/ML IJ SOLN
INTRAMUSCULAR | Status: AC
  Filled 2022-02-22: qty 2

## 2022-02-22 MED ORDER — ONDANSETRON HCL 4 MG/2ML IJ SOLN: 4.0000 mg | Freq: Four times a day (QID) | INTRAMUSCULAR | Status: DC | PRN

## 2022-02-22 MED ORDER — OXYTOCIN-SODIUM CHLORIDE 30-0.9 UT/500ML-% IV SOLN
2.5000 [IU]/h | INTRAVENOUS | Status: DC
  Filled 2022-02-22: qty 500

## 2022-02-22 MED ORDER — CEFAZOLIN SODIUM-DEXTROSE 2-4 GM/100ML-% IV SOLN
2.0000 g | Freq: Once | INTRAVENOUS | Status: AC
  Administered 2022-02-22: 2 g via INTRAVENOUS
  Filled 2022-02-22: qty 100

## 2022-02-22 MED ORDER — LACTATED RINGERS IV SOLN
500.0000 mL | INTRAVENOUS | Status: DC | PRN
  Administered 2022-02-22: 1000 mL via INTRAVENOUS

## 2022-02-22 MED ORDER — SOD CITRATE-CITRIC ACID 500-334 MG/5ML PO SOLN: 30.0000 mL | ORAL | Status: DC | PRN

## 2022-02-22 MED ORDER — OXYTOCIN BOLUS FROM INFUSION
333.0000 mL | Freq: Once | INTRAVENOUS | Status: AC
  Administered 2022-02-23: 333 mL via INTRAVENOUS

## 2022-02-22 MED ORDER — LIDOCAINE HCL (PF) 1 % IJ SOLN
INTRAMUSCULAR | Status: AC
  Filled 2022-02-22: qty 30

## 2022-02-22 MED ORDER — FENTANYL CITRATE (PF) 100 MCG/2ML IJ SOLN: 50.0000 ug | INTRAMUSCULAR | Status: DC | PRN

## 2022-02-22 MED ORDER — CEFAZOLIN SODIUM-DEXTROSE 1-4 GM/50ML-% IV SOLN
1.0000 g | Freq: Three times a day (TID) | INTRAVENOUS | Status: DC
Start: 2022-02-23 — End: 2022-02-23
  Administered 2022-02-23: 1 g via INTRAVENOUS
  Filled 2022-02-22 (×2): qty 50

## 2022-02-22 MED ORDER — ACETAMINOPHEN 500 MG PO TABS: 1000.0000 mg | ORAL_TABLET | Freq: Four times a day (QID) | ORAL | Status: DC | PRN

## 2022-02-22 NOTE — H&P (Addendum)
OB History & Physical  ? ?History of Present Illness:  ? ?Chief Complaint: contractions  ? ?HPI:  ?Sonya Oconnor is a 36 y.o. G70P2002 female at [redacted]w[redacted]d dated by LMP c/w [place] at [redacted]w[redacted]d.  She presents to L&D for regular contractions that have gotten progressively stronger over the past 3 hours.  Denies LOF or vaginal bleeding.  Endorses good fetal movement. ? ?Reports active fetal movement  ?Contractions: every 3 to 5 minutes starting around 1900 ?LOF/SROM: denies  ?Vaginal bleeding: denies  ? ?Factors complicating pregnancy:  ?GBS pos  ?AMA ?A1GDM ?Transfer of care at 27 weeks  ? ?Patient Active Problem List  ? Diagnosis Date Noted  ? Uterine contractions during pregnancy 02/22/2022  ? Normal labor 02/22/2022  ? Diet controlled gestational diabetes mellitus (GDM), antepartum 12/23/2021  ? AMA (advanced maternal age) multigravida 35+, third trimester 11/24/2021  ? Supervision of other normal pregnancy, antepartum 08/12/2021  ? ? ? ?Maternal Medical History:  ? ?Past Medical History:  ?Diagnosis Date  ? GERD (gastroesophageal reflux disease)   ? Gestational diabetes   ? ? ?Past Surgical History:  ?Procedure Laterality Date  ? NO PAST SURGERIES    ? ? ?Allergies  ?Allergen Reactions  ? Penicillins Rash  ? ? ?Prior to Admission medications   ?Medication Sig Start Date End Date Taking? Authorizing Provider  ?ACCU-CHEK GUIDE test strip USE 1 STRIP 4 TIMES A DAY AS DIRECTED 12/15/21   [provider]  ?Accu-Chek Softclix Lancets lancets SMARTSIG:1 Each Topical 4 Times Daily 12/15/21   [provider]  ?ferrous sulfate 325 (65 FE) MG tablet Take 1 tablet by mouth daily.    [provider]  ?prenatal vitamin w/FE, FA (PRENATAL 1 + 1) 27-1 MG TABS tablet Take 1 tablet by mouth daily at 12 noon.    [provider]  ? ? ? ?Prenatal care site:  ?[facility] ? ?Social History: She  reports that she has never smoked. She has never used smokeless tobacco. She reports that she does not drink  alcohol and does not use drugs. ? ?Family History: family history includes Colon cancer in her maternal grandmother; Hypertension in her father; Mental illness in her father; Thyroid disease in her mother and paternal grandmother.  ? ?Review of Systems: A full review of systems was performed and negative except as noted in the HPI.   ? ? ?Physical Exam:  ?Vital Signs: BP 128/90   Pulse 85   Temp 98.9 ?F (37.2 ?C) (Oral)   Resp 16   Ht 5\' 3"  (1.6 m)   Wt 78 kg   LMP 05/17/2021   BMI 30.47 kg/m?  ?Physical Exam ? ?General: no acute distress.  ?HEENT: normocephalic, atraumatic ?Heart: regular rate & rhythm.  No murmurs/rubs/gallops ?Lungs: clear to auscultation bilaterally, normal respiratory effort ?Abdomen: soft, gravid, non-tender;  EFW: 8lbs ?Pelvic:  ? External: Normal external female genitalia ? Cervix: Dilation: (P) 4.5 / Effacement (%): (P) 70 / Station: (P) -2  ?  ?Extremities: non-tender, symmetric, No edema bilaterally.  DTRs: 2+/2+  ?Neurologic: Alert & oriented x 3.   ? ?No results found for this or any previous visit (from the past 24 hour(s)). ? ?Pertinent Results:  ?Prenatal Labs: ?Blood type/Rh O pos  ?Antibody screen neg  ?Rubella Immune  ?Varicella Immune  ?RPR NR  ?HBsAg Neg  ?HIV NR  ?GC neg  ?Chlamydia neg  ?Genetic screening Declined   ?1 hour GTT 137  ?3 hour GTT 94, 197, 167, 126  ?  GBS Pos  ? ?FHT:  FHR: 140 bpm, variability: moderate,  accelerations:  Present,  decelerations:  Absent ?Category/reactivity:  Category I ?UC:   regular, every 3-5 minutes ?  ?Cephalic by Leopolds and SVE  ? ?No results found. ? ?Assessment:  ?Sonya Oconnor is a 36 y.o. G11P2002 female at [redacted]w[redacted]d with early labor.  ? ?Plan:  ?1. Admit to Labor & Delivery ?- consents reviewed and obtained ? ?2. Fetal Well being  ?- Fetal Tracing: cat 1 ?- Group B Streptococcus ppx indicated: GBS pos ?- Will treat with Ancef - PCN allergy, rash as infant  ?- Presentation: cephalic confirmed by SVE  ? ?3. Routine OB: ?-  Prenatal labs reviewed, as above ?- Rh pos ?- CBC, T&S, RPR on admit ?- Carb modified diet, saline lock ?- CBG every 4 hours ? ?4. Monitoring of labor  ?- Contractions monitored with external toco ?- Pelvis proven to 4054g, adequate for trial of labor  ?- Plan for expectant management  ?- Augmentation with AROM as appropriate  ?- Plan for  intermittent fetal monitoring  ?- Maternal pain control as desired; planning regional anesthesia ?- Anticipate vaginal delivery ? ?5. Post Partum Planning: ?- Infant feeding: Breast ?- Contraception: Considering vasectomy versus IUD ?- Tdap vaccine: declined  ?- Flu vaccine: declined  ? ?Gustavo Lah, CNM ?02/22/22 ?9:55 PM ? ?Margaretmary Eddy, CNM ?Certified Nurse Midwife ?[facility] ?[facility]  ? ? ? ?

## 2022-02-22 NOTE — OB Triage Note (Signed)
Pt is a G3P2 at 40.1 w presents with UCs q 3 min x 2 hours. Reports +FM. States have been monitoring BS during pregnancy q4 day. No medications. Denies any past OB problems with other pregnancies.  ?

## 2022-02-23 ENCOUNTER — Inpatient Hospital Stay: Payer: 59 | Admitting: Anesthesiology

## 2022-02-23 ENCOUNTER — Encounter: Payer: Self-pay | Admitting: Obstetrics and Gynecology

## 2022-02-23 DIAGNOSIS — Z3A40 40 weeks gestation of pregnancy: Secondary | ICD-10-CM | POA: Diagnosis not present

## 2022-02-23 DIAGNOSIS — O480 Post-term pregnancy: Secondary | ICD-10-CM | POA: Diagnosis not present

## 2022-02-23 DIAGNOSIS — O99824 Streptococcus B carrier state complicating childbirth: Secondary | ICD-10-CM | POA: Diagnosis not present

## 2022-02-23 LAB — GLUCOSE, CAPILLARY
Glucose-Capillary: 117 mg/dL — ABNORMAL HIGH (ref 70–99)
Glucose-Capillary: 145 mg/dL — ABNORMAL HIGH (ref 70–99)

## 2022-02-23 LAB — TYPE AND SCREEN
ABO/RH(D): O POS
Antibody Screen: NEGATIVE

## 2022-02-23 LAB — ABO/RH: ABO/RH(D): O POS

## 2022-02-23 LAB — RPR: RPR Ser Ql: NONREACTIVE

## 2022-02-23 MED ORDER — COCONUT OIL OIL: 1.0000 "application " | TOPICAL_OIL | Status: DC | PRN

## 2022-02-23 MED ORDER — TERBUTALINE SULFATE 1 MG/ML IJ SOLN: 0.2500 mg | Freq: Once | INTRAMUSCULAR | Status: DC | PRN

## 2022-02-23 MED ORDER — PRENATAL PLUS 27-1 MG PO TABS
1.0000 | ORAL_TABLET | Freq: Every day | ORAL | Status: DC
  Administered 2022-02-23 – 2022-02-24 (×2): 1 via ORAL
  Filled 2022-02-23 (×2): qty 1

## 2022-02-23 MED ORDER — OXYTOCIN-SODIUM CHLORIDE 30-0.9 UT/500ML-% IV SOLN
1.0000 m[IU]/min | INTRAVENOUS | Status: DC
  Administered 2022-02-23: 2 m[IU]/min via INTRAVENOUS

## 2022-02-23 MED ORDER — ZOLPIDEM TARTRATE 5 MG PO TABS: 5.0000 mg | ORAL_TABLET | Freq: Every evening | ORAL | Status: DC | PRN

## 2022-02-23 MED ORDER — IBUPROFEN 600 MG PO TABS
600.0000 mg | ORAL_TABLET | Freq: Four times a day (QID) | ORAL | Status: DC
  Administered 2022-02-23 – 2022-02-25 (×8): 600 mg via ORAL
  Filled 2022-02-23 (×8): qty 1

## 2022-02-23 MED ORDER — LIDOCAINE HCL (PF) 1 % IJ SOLN
INTRAMUSCULAR | Status: DC | PRN
Start: 2022-02-23 — End: 2022-02-23
  Administered 2022-02-23: 3 mL

## 2022-02-23 MED ORDER — ONDANSETRON HCL 4 MG/2ML IJ SOLN: 4.0000 mg | INTRAMUSCULAR | Status: DC | PRN

## 2022-02-23 MED ORDER — LACTATED RINGERS IV SOLN
500.0000 mL | Freq: Once | INTRAVENOUS | Status: AC
  Administered 2022-02-23: 500 mL via INTRAVENOUS

## 2022-02-23 MED ORDER — SENNOSIDES-DOCUSATE SODIUM 8.6-50 MG PO TABS
2.0000 | ORAL_TABLET | Freq: Every day | ORAL | Status: DC
  Administered 2022-02-24 – 2022-02-25 (×2): 2 via ORAL
  Filled 2022-02-23 (×2): qty 2

## 2022-02-23 MED ORDER — SIMETHICONE 80 MG PO CHEW: 80.0000 mg | CHEWABLE_TABLET | ORAL | Status: DC | PRN

## 2022-02-23 MED ORDER — BENZOCAINE-MENTHOL 20-0.5 % EX AERO: 1.0000 "application " | INHALATION_SPRAY | CUTANEOUS | Status: DC | PRN

## 2022-02-23 MED ORDER — BUPIVACAINE HCL (PF) 0.25 % IJ SOLN
INTRAMUSCULAR | Status: DC | PRN
  Administered 2022-02-23: 3 mL via EPIDURAL
  Administered 2022-02-23: 5 mL via EPIDURAL

## 2022-02-23 MED ORDER — PRENATAL MULTIVITAMIN CH: 1.0000 | ORAL_TABLET | Freq: Every day | ORAL | Status: DC

## 2022-02-23 MED ORDER — PHENYLEPHRINE 80 MCG/ML (10ML) SYRINGE FOR IV PUSH (FOR BLOOD PRESSURE SUPPORT)
80.0000 ug | PREFILLED_SYRINGE | INTRAVENOUS | Status: DC | PRN
  Filled 2022-02-23: qty 10

## 2022-02-23 MED ORDER — EPHEDRINE 5 MG/ML INJ
10.0000 mg | INTRAVENOUS | Status: DC | PRN
  Filled 2022-02-23: qty 2

## 2022-02-23 MED ORDER — DIPHENHYDRAMINE HCL 50 MG/ML IJ SOLN: 12.5000 mg | INTRAMUSCULAR | Status: DC | PRN

## 2022-02-23 MED ORDER — DIPHENHYDRAMINE HCL 25 MG PO CAPS: 25.0000 mg | ORAL_CAPSULE | Freq: Four times a day (QID) | ORAL | Status: DC | PRN

## 2022-02-23 MED ORDER — ACETAMINOPHEN 325 MG PO TABS
650.0000 mg | ORAL_TABLET | ORAL | Status: DC | PRN
  Administered 2022-02-23 – 2022-02-24 (×3): 650 mg via ORAL
  Filled 2022-02-23 (×3): qty 2

## 2022-02-23 MED ORDER — DIBUCAINE (PERIANAL) 1 % EX OINT: 1.0000 "application " | TOPICAL_OINTMENT | CUTANEOUS | Status: DC | PRN

## 2022-02-23 MED ORDER — ONDANSETRON HCL 4 MG PO TABS: 4.0000 mg | ORAL_TABLET | ORAL | Status: DC | PRN

## 2022-02-23 MED ORDER — LIDOCAINE-EPINEPHRINE (PF) 1.5 %-1:200000 IJ SOLN
INTRAMUSCULAR | Status: DC | PRN
Start: 2022-02-23 — End: 2022-02-23
  Administered 2022-02-23: 4 mL via EPIDURAL

## 2022-02-23 MED ORDER — WITCH HAZEL-GLYCERIN EX PADS: 1.0000 "application " | MEDICATED_PAD | CUTANEOUS | Status: DC | PRN

## 2022-02-23 MED ORDER — TETANUS-DIPHTH-ACELL PERTUSSIS 5-2.5-18.5 LF-MCG/0.5 IM SUSY
0.5000 mL | PREFILLED_SYRINGE | Freq: Once | INTRAMUSCULAR | Status: DC
  Filled 2022-02-23: qty 0.5

## 2022-02-23 MED ORDER — FENTANYL-BUPIVACAINE-NACL 0.5-0.125-0.9 MG/250ML-% EP SOLN
12.0000 mL/h | EPIDURAL | Status: DC | PRN
  Administered 2022-02-23: 12 mL/h via EPIDURAL
  Filled 2022-02-23: qty 250

## 2022-02-23 NOTE — Progress Notes (Signed)
Labor Progress Note ? ?Sonya Oconnor is a 36 y.o. G3P2002 at [redacted]w[redacted]d by ultrasound admitted for early labor and GBS positive ? ?Subjective: feeling more comfortable after epidural ? ?Objective: ?BP 115/80   Pulse 77   Temp 98.8 ?F (37.1 ?C) (Oral)   Resp 18   Ht 5\' 3"  (1.6 m)   Wt 78 kg   LMP 05/17/2021   SpO2 97%   BMI 30.47 kg/m?  ?Notable VS details: reviewed  ? ?Fetal Assessment: ?FHT:  FHR: 130 bpm, variability: moderate,  accelerations:  Present,  decelerations:  Absent ?Category/reactivity:  Category I ?UC:   regular, every 2-3 minutes ?SVE:   6/80/-1/soft/middle ?Membrane status: AROM at 0626 ?Amniotic color: Clear  ? ?Labs: ?Lab Results  ?Component Value Date  ? WBC 8.1 02/22/2022  ? HGB 12.4 02/22/2022  ? HCT 36.5 02/22/2022  ? MCV 88.6 02/22/2022  ? PLT 205 02/22/2022  ? ? ?Assessment / Plan: ?Spontaneous labor - minimal to no cervical change over past 3 1/2 hours ?-AROM performed for augmentation  ? ?Labor:  augmentation with AROM  ?Fetal Wellbeing:  Category I ?Pain Control:  Epidural ?I/D:   GBS pos, Ancef x 2 doses  ?Anticipated MOD:  NSVD ? ?Minda Meo, CNM ?02/23/2022, 6:34 AM ? ? ? ? ? ? ? ? ?

## 2022-02-23 NOTE — Anesthesia Procedure Notes (Signed)
Epidural ?Patient location during procedure: OB ?End time: 02/23/2022 3:20 AM ? ?Staffing ?Anesthesiologist: Yevette Edwards, MD ?Performed: anesthesiologist  ? ?Preanesthetic Checklist ?Completed: patient identified, IV checked, site marked, risks and benefits discussed, surgical consent, monitors and equipment checked, pre-op evaluation and timeout performed ? ?Epidural ?Patient position: sitting ?Prep: ChloraPrep ?Patient monitoring: heart rate, continuous pulse ox and blood pressure ?Approach: midline ?Location: L5-S1 ?Injection technique: LOR saline ? ?Needle:  ?Needle type: Tuohy  ?Needle gauge: 17 G ?Needle length: 9 cm and 9 ?Needle insertion depth: 5 cm ?Catheter type: closed end flexible ?Catheter size: 19 Gauge ?Catheter at skin depth: 11 cm ?Test dose: negative and 1.5% lidocaine with Epi 1:200 K ? ?Assessment ?Sensory level: T10 ?Events: blood not aspirated, injection not painful, no injection resistance, no paresthesia and negative IV test ? ?Additional Notes ?1st attempt ?Pt. Evaluated and documentation done after procedure finished. ?Patient identified. Risks/Benefits/Options discussed with patient including but not limited to bleeding, infection, nerve damage, paralysis, failed block, incomplete pain control, headache, blood pressure changes, nausea, vomiting, reactions to medication both or allergic, itching and postpartum back pain. Confirmed with bedside nurse the patient's most recent platelet count. Confirmed with patient that they are not currently taking any anticoagulation, have any bleeding history or any family history of bleeding disorders. Patient expressed understanding and wished to proceed. All questions were answered. Sterile technique was used throughout the entire procedure. Please see nursing notes for vital signs. Test dose was given through epidural catheter and negative prior to continuing to dose epidural or start infusion. Warning signs of high block given to the patient  including shortness of breath, tingling/numbness in hands, complete motor block, or any concerning symptoms with instructions to call for help. Patient was given instructions on fall risk and not to get out of bed. All questions and concerns addressed with instructions to call with any issues or inadequate analgesia.   ? ?Patient tolerated the insertion well without immediate complications.Reason for block:procedure for pain ? ? ? ?

## 2022-02-23 NOTE — Progress Notes (Signed)
Labor Progress Note ? ?Sonya Oconnor is a 36 y.o. G3P2002 at [redacted]w[redacted]d by ultrasound admitted for early labor and GBS positive ? ?Subjective: feels comfortable after her epidural, denies any pain ? ?Objective: ?BP (!) 94/53   Pulse 86   Temp 98.8 ?F (37.1 ?C) (Oral)   Resp 18   Ht 5\' 3"  (1.6 m)   Wt 78 kg   LMP 05/17/2021   SpO2 96%   BMI 30.47 kg/m?  ?Notable VS details: reviewed ? ?Fetal Assessment: ?FHT:  FHR: 135 bpm, variability: moderate,  accelerations:  Present,  decelerations:  Absent ?Category/reactivity:  Category I ?UC:   regular, every 2-3 minutes ?SVE:    ?Dilation: 6cm  ?Effacement: 80%  ?Station:  -1  ?Consistency: medium  ?Position: middle  ?Membrane status:AROM @ 07/18/2021 ?Amniotic color: clear ? ?Labs: ?Lab Results  ?Component Value Date  ? WBC 8.1 02/22/2022  ? HGB 12.4 02/22/2022  ? HCT 36.5 02/22/2022  ? MCV 88.6 02/22/2022  ? PLT 205 02/22/2022  ? ? ?Assessment / Plan: ?Spontaneous labor, minimal to no cervical change over past 5.5 hours. ? ?Labor:  augmentation with pitocin  started ?Preeclampsia:  no signs or symptoms of toxicity ?Fetal Wellbeing:  Category I ?Pain Control:  Epidural ?I/D:   GBS pos, Ancef x 2 doses ?Anticipated MOD:  NSVD ? ?02/24/2022, CNM ?02/23/2022, 8:53 AM ? ? ? ? ? ? ? ? ?

## 2022-02-23 NOTE — Anesthesia Preprocedure Evaluation (Signed)
Anesthesia Evaluation  ?Patient identified by MRN, date of birth, ID band ?Patient awake ? ? ? ?Reviewed: ?Allergy & Precautions, H&P , NPO status , Patient's Chart, lab work & pertinent test results, reviewed documented beta blocker date and time  ? ?Airway ?Mallampati: II ? ?TM Distance: >3 FB ?Neck ROM: full ? ? ? Dental ?no notable dental hx. ?(+) Teeth Intact ?  ?Pulmonary ? ?  ?Pulmonary exam normal ?breath sounds clear to auscultation ? ? ? ? ? ? Cardiovascular ?Exercise Tolerance: Good ?negative cardio ROS ? ? ?Rhythm:regular Rate:Normal ? ? ?  ?Neuro/Psych ?negative neurological ROS ? negative psych ROS  ? GI/Hepatic ?Neg liver ROS, GERD  Medicated,  ?Endo/Other  ?negative endocrine ROSdiabetes, Well Controlled, Gestational ? Renal/GU ?  ? ?  ?Musculoskeletal ? ? Abdominal ?  ?Peds ? Hematology ?negative hematology ROS ?(+)   ?Anesthesia Other Findings ? ? Reproductive/Obstetrics ?(+) Pregnancy ? ?  ? ? ? ? ? ? ? ? ? ? ? ? ? ?  ?  ? ? ? ? ? ? ? ? ?Anesthesia Physical ?Anesthesia Plan ? ?ASA: 2 ? ?Anesthesia Plan: Epidural  ? ?Post-op Pain Management:   ? ?Induction:  ? ?PONV Risk Score and Plan:  ? ?Airway Management Planned:  ? ?Additional Equipment:  ? ?Intra-op Plan:  ? ?Post-operative Plan:  ? ?Informed Consent: I have reviewed the patients History and Physical, chart, labs and discussed the procedure including the risks, benefits and alternatives for the proposed anesthesia with the patient or authorized representative who has indicated his/her understanding and acceptance.  ? ? ? ? ? ?Plan Discussed with:  ? ?Anesthesia Plan Comments:   ? ? ? ? ? ? ?Anesthesia Quick Evaluation ? ?

## 2022-02-23 NOTE — Progress Notes (Signed)
Pt was ambulating in hall. Will now try to rest and will call if UCs increase of SROM. Will reevaluate prn. Pt aware that 2nd antibiotic dose for GBS due at 0600 ?

## 2022-02-23 NOTE — Discharge Summary (Signed)
Obstetrical Discharge Summary ? ?Patient Name: Sonya Oconnor ?DOB: [birth year] ?MRN: [id number] ? ?Date of Admission: 02/22/2022 ?Date of Delivery: 02/23/2022 ?Delivered by: Donato Schultz, CNM ?Date of Discharge: 02/25/2022 ? ?Primary OB: [facility]  ?ESP:QZRAQTM'A last menstrual period was 05/17/2021. ?EDC Estimated Date of Delivery: 02/21/22 ?Gestational Age at Delivery: [redacted]w[redacted]d  ? ?Antepartum complications:  ?GBS pos  ?AMA ?A1GDM ?Transfer of care at 27 weeks  ? ?Admitting Diagnosis: early labor ?Secondary Diagnosis: SVD ? ?Patient Active Problem List  ? Diagnosis Date Noted  ? Uterine contractions during pregnancy 02/22/2022  ? Normal labor 02/22/2022  ? Diet controlled gestational diabetes mellitus (GDM), antepartum 12/23/2021  ? AMA (advanced maternal age) multigravida 35+, third trimester 11/24/2021  ? Supervision of other normal pregnancy, antepartum 08/12/2021  ? ? ?Augmentation: AROM and Pitocin ?Complications: None ?Intrapartum complications/course: She arrived with contractions. She received two doses of Ancef and slowly progressed to 6/80/-1. AROM then pitocin. She progressed to 10/100/+3 and pushed for , delivering viable female infant over intact perineum.  ?Date of Delivery: 02/23/2022 ?Delivered By: Donato Schultz, CNM ?Delivery Type: spontaneous vaginal delivery ?Anesthesia: epidural ?Placenta: spontaneous ?Laceration: none ?Episiotomy: none ?Newborn Data: ?Live born female "Sonya Oconnor" ?Birth Weight:  8#9 ?APGAR: 8, 9 ? ?Newborn Delivery   ?Birth date/time: 02/23/2022 10:11:00 ?Delivery type: Vaginal, Spontaneous ?  ? ?Postpartum Procedures: none ? ?Edinburgh:  ? ?  02/24/2022  ?  4:02 PM  ?Edinburgh Postnatal Depression Scale Screening Tool  ?I have been able to laugh and see the funny side of things. 0  ?I have looked forward with enjoyment to things. 0  ?I have blamed myself unnecessarily when things went wrong. 1  ?I have been anxious or worried for no good reason. 1  ?I have felt scared or  panicky for no good reason. 1  ?Things have been getting on top of me. 1  ?I have been so unhappy that I have had difficulty sleeping. 1  ?I have felt sad or miserable. 1  ?I have been so unhappy that I have been crying. 1  ?The thought of harming myself has occurred to me. 0  ?Edinburgh Postnatal Depression Scale Total 7  ?  ? ? ?Post partum course:  ?Patient had an uncomplicated postpartum course.  By time of discharge on PPD#2, her pain was controlled on oral pain medications; she had appropriate lochia and was ambulating, voiding without difficulty and tolerating regular diet.  She was deemed stable for discharge to home.   ? ?Discharge Physical Exam:  ?BP 106/71 (BP Location: Left Arm)   Pulse 71   Temp 98.3 ?F (36.8 ?C) (Oral)   Resp 20   Ht 5\' 3"  (1.6 m)   Wt 78 kg   LMP 05/17/2021   SpO2 99%   BMI 30.47 kg/m?  ? ?General: NAD ?CV: RRR ?Pulm: CTABL, nl effort ?ABD: s/nd/nt, fundus firm and below the umbilicus ?Lochia: small ?Perineum: intact ?DVT Evaluation: LE non-ttp, no evidence of DVT on exam. ? ?Hemoglobin  ?Date Value Ref Range Status  ?02/24/2022 11.2 (L) 12.0 - 15.0 g/dL Final  ? ?HGB  ?Date Value Ref Range Status  ?02/12/2014 10.9 (L) 12.0 - 16.0 g/dL Final  ? ?HCT  ?Date Value Ref Range Status  ?02/24/2022 33.3 (L) 36.0 - 46.0 % Final  ?02/13/2014 32.1 (L) 35.0 - 47.0 % Final  ? ? ? ?Disposition: stable, discharge to home. ?Baby Feeding: breastmilk ?Baby Disposition: home with mom ? ?Rh Immune globulin given: n/a, O pos ?Rubella vaccine  given: immune ?Varicella vaccine given: immune ?Tdap vaccine given in AP or PP setting: declined ?Flu vaccine given in AP or PP setting: declined ? ?Contraception: considering vasectomy or IUD ? ?Prenatal Labs:  ?Blood type/Rh O pos  ?Antibody screen neg  ?Rubella Immune  ?Varicella Immune  ?RPR NR  ?HBsAg Neg  ?HIV NR  ?GC neg  ?Chlamydia neg  ?Genetic screening Declined   ?1 hour GTT 137  ?3 hour GTT 94, 197, 167, 126  ?GBS Pos  ? ? ? ?Plan:  ?Sonya Oconnor was discharged to home in good condition. ?Follow-up appointment with delivering provider in 6 weeks. ? ?Discharge Medications: ?Allergies as of 02/25/2022   ? ?   Reactions  ? Penicillins Rash  ? ?  ? ?  ?Medication List  ?  ? ?TAKE these medications   ? ?Accu-Chek Guide test strip ?Generic drug: glucose blood ?USE 1 STRIP 4 TIMES A DAY AS DIRECTED ?  ?Accu-Chek Softclix Lancets lancets ?SMARTSIG:1 Each Topical 4 Times Daily ?  ?acetaminophen 325 MG tablet ?Commonly known as: Tylenol ?Take 2 tablets (650 mg total) by mouth every 4 (four) hours as needed (for pain scale < 4). ?  ?benzocaine-Menthol 20-0.5 % Aero ?Commonly known as: DERMOPLAST ?Apply 1 application. topically as needed for irritation (perineal discomfort). ?  ?coconut oil Oil ?Apply 1 application. topically as needed. ?  ?diphenhydrAMINE 25 mg capsule ?Commonly known as: BENADRYL ?Take 1 capsule (25 mg total) by mouth every 6 (six) hours as needed for itching. ?  ?ferrous sulfate 325 (65 FE) MG tablet ?Take 1 tablet by mouth daily. ?  ?ibuprofen 600 MG tablet ?Commonly known as: ADVIL ?Take 1 tablet (600 mg total) by mouth every 6 (six) hours. ?  ?prenatal vitamin w/FE, FA 27-1 MG Tabs tablet ?Take 1 tablet by mouth daily at 12 noon. ?  ?senna-docusate 8.6-50 MG tablet ?Commonly known as: Senokot-S ?Take 2 tablets by mouth daily. ?  ?simethicone 80 MG chewable tablet ?Commonly known as: MYLICON ?Chew 1 tablet (80 mg total) by mouth as needed for flatulence. ?  ?witch hazel-glycerin pad ?Commonly known as: TUCKS ?Apply 1 application. topically as needed for hemorrhoids. ?  ? ?  ? ? ? Follow-up Information   ? ? Janyce Llanos, CNM Follow up in 6 week(s).   ?Specialty: Certified Nurse Midwife ?Why: 6wk postpartum ?Contact information: ?[address] ?[place] [address] ?[phone] ? ? ?  ?  ? ?  ?  ? ?  ? ? ?Signed: ? ?Randa Ngo, CNM ?02/25/2022 ?8:46 AM ? ? ?

## 2022-02-24 DIAGNOSIS — Z412 Encounter for routine and ritual male circumcision: Secondary | ICD-10-CM | POA: Diagnosis not present

## 2022-02-24 LAB — CBC
HCT: 33.3 % — ABNORMAL LOW (ref 36.0–46.0)
Hemoglobin: 11.2 g/dL — ABNORMAL LOW (ref 12.0–15.0)
MCH: 29.6 pg (ref 26.0–34.0)
MCHC: 33.6 g/dL (ref 30.0–36.0)
MCV: 88.1 fL (ref 80.0–100.0)
Platelets: 193 10*3/uL (ref 150–400)
RBC: 3.78 MIL/uL — ABNORMAL LOW (ref 3.87–5.11)
RDW: 13.2 % (ref 11.5–15.5)
WBC: 10.8 10*3/uL — ABNORMAL HIGH (ref 4.0–10.5)
nRBC: 0 % (ref 0.0–0.2)

## 2022-02-24 LAB — GLUCOSE, CAPILLARY: Glucose-Capillary: 119 mg/dL — ABNORMAL HIGH (ref 70–99)

## 2022-02-24 NOTE — Anesthesia Postprocedure Evaluation (Signed)
Anesthesia Post Note ? ?Patient: Sonya Oconnor ? ?Procedure(s) Performed: AN AD HOC LABOR EPIDURAL ? ?Patient location during evaluation: Mother Baby ?Anesthesia Type: Epidural ?Level of consciousness: awake and alert ?Pain management: pain level controlled ?Vital Signs Assessment: post-procedure vital signs reviewed and stable ?Respiratory status: spontaneous breathing, nonlabored ventilation and respiratory function stable ?Cardiovascular status: stable ?Postop Assessment: no headache, no backache and epidural receding ?Anesthetic complications: no ? ? ?No notable events documented. ? ? ?Last Vitals:  ?Vitals:  ? 02/24/22 0451 02/24/22 0736  ?BP: 107/69 105/65  ?Pulse: 78 83  ?Resp: 20 18  ?Temp: 36.7 ?C 36.7 ?C  ?SpO2: 99% 98%  ?  ?Last Pain:  ?Vitals:  ? 02/24/22 0736  ?TempSrc: Oral  ?PainSc:   ? ? ?  ?  ?  ?  ?  ?  ? ?Lynden Oxford ? ? ? ? ?

## 2022-02-24 NOTE — Lactation Note (Signed)
This note was copied from a baby's chart. ?Lactation Consultation Note ? ?Patient Name: Sonya Oconnor ?Today's Date: 02/24/2022 ?Reason for consult: Follow-up assessment;Term ?Age:38 hours ? ?Maternal Data ?Has patient been taught Hand Expression?: Yes ?Does the patient have breastfeeding experience prior to this delivery?: Yes ?How long did the patient breastfeed?: 2 wks ?Other 2 children had tongue ties and ? 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 ?LATCH Score ?Latch: Grasps breast easily, tongue down, lips flanged, rhythmical sucking. ? ?Audible Swallowing: Spontaneous and intermittent ? ?Type of Nipple: Everted at rest and after stimulation ? ?Comfort (Breast/Nipple): Filling, red/small blisters or bruises, mild/mod discomfort ? ?Hold (Positioning): Assistance needed to correctly position infant at breast and maintain latch. ? ?LATCH Score: 8 ? ? ?Lactation Tools Discussed/Used ? LC name and no written on white board ? ?Interventions ?Interventions: Breast feeding basics reviewed;Assisted with latch;Hand express;Support pillows;Comfort gels;Education ? ?Discharge ?Pump: Personal ?WIC Program: No ? ?Consult Status ?Consult Status: PRN ? ? ? ?Dyann Kief ?02/24/2022, 6:08 PM ? ? ? ?

## 2022-02-24 NOTE — Progress Notes (Addendum)
Postpartum Day  1 ? ?Subjective: ?no complaints, up ad lib, voiding, tolerating PO, and + flatus ? ?Doing well, no concerns. Ambulating without difficulty, pain managed with PO meds, tolerating regular diet, and voiding without difficulty.  ? ?No fever/chills, chest pain, shortness of breath, nausea/vomiting, or leg pain. No nipple or breast pain. No headache, visual changes, or RUQ/epigastric pain. ? ?Objective: ?BP 105/65 (BP Location: Right Arm)   Pulse 83   Temp 98 ?F (36.7 ?C) (Oral)   Resp 18   Ht 5\' 3"  (1.6 m)   Wt 78 kg   LMP 05/17/2021   SpO2 98%   BMI 30.47 kg/m?  ?  ?Physical Exam:  ?General: alert, cooperative, and appears stated age ?Breasts: soft/nontender ?CV: RRR ?Pulm: nl effort, CTABL ?Abdomen: soft, non-tender, active bowel sounds ?Uterine Fundus: firm ?Perineum: minimal edema, intact ?Lochia: appropriate ?DVT Evaluation: No evidence of DVT seen on physical exam. ?Negative Homan's sign. ?No cords or calf tenderness. ?No significant calf/ankle edema. ? ?Recent Labs  ?  02/22/22 ?2220 02/24/22 ?02/26/22  ?HGB 12.4 11.2*  ?HCT 36.5 33.3*  ?WBC 8.1 10.8*  ?PLT 205 193  ? ? ?Assessment/Plan: ?36 y.o. G3P2002 postpartum day # 1 ? ?-Continue routine postpartum care ?-Lactation consult PRN for breastfeeding -Discussed contraceptive options including implant, IUDs hormonal and non-hormonal, injection, pills/ring/patch, condoms, and NFP.Patient wants Mirena IUD  ?-Acute blood loss anemia - hemodynamically stable and asymptomatic; start PO ferrous sulfate BID with stool softeners  ?-Immunization status:   all immunizations up to date ? ? ?Disposition:  Does not Desires discharge home today ? ? LOS: 2 days  ? ? ?----- ?30 ?Certified Nurse Midwife ?[facility] ?[facility]  ?

## 2022-02-25 MED ORDER — WITCH HAZEL-GLYCERIN EX PADS
1.0000 | MEDICATED_PAD | CUTANEOUS | 12 refills | Status: AC | PRN
Start: 2022-02-25 — End: ?

## 2022-02-25 MED ORDER — DIPHENHYDRAMINE HCL 25 MG PO CAPS
25.0000 mg | ORAL_CAPSULE | Freq: Four times a day (QID) | ORAL | 0 refills | Status: AC | PRN
Start: 2022-02-25 — End: ?

## 2022-02-25 MED ORDER — COCONUT OIL OIL: 1.0000 "application " | TOPICAL_OIL | 0 refills | Status: AC | PRN

## 2022-02-25 MED ORDER — IBUPROFEN 600 MG PO TABS: 600.0000 mg | ORAL_TABLET | Freq: Four times a day (QID) | ORAL | 0 refills | Status: AC

## 2022-02-25 MED ORDER — SIMETHICONE 80 MG PO CHEW: 80.0000 mg | CHEWABLE_TABLET | ORAL | 0 refills | Status: AC | PRN

## 2022-02-25 MED ORDER — ACETAMINOPHEN 325 MG PO TABS: 650.0000 mg | ORAL_TABLET | ORAL | 0 refills | Status: AC | PRN

## 2022-02-25 MED ORDER — BENZOCAINE-MENTHOL 20-0.5 % EX AERO: 1.0000 "application " | INHALATION_SPRAY | CUTANEOUS | Status: AC | PRN

## 2022-02-25 MED ORDER — SENNOSIDES-DOCUSATE SODIUM 8.6-50 MG PO TABS: 2.0000 | ORAL_TABLET | Freq: Every day | ORAL | 0 refills | Status: AC

## 2022-02-25 NOTE — Progress Notes (Signed)
Mother discharged. Discharge instructions given. Mother verbalizes understanding. Transported by axillary.  

## 2022-02-25 NOTE — Discharge Instructions (Signed)

## 2022-02-27 DIAGNOSIS — Z713 Dietary counseling and surveillance: Secondary | ICD-10-CM | POA: Diagnosis not present

## 2022-02-27 DIAGNOSIS — Z00110 Health examination for newborn under 8 days old: Secondary | ICD-10-CM | POA: Diagnosis not present

## 2022-03-06 DIAGNOSIS — Z419 Encounter for procedure for purposes other than remedying health state, unspecified: Secondary | ICD-10-CM | POA: Diagnosis not present

## 2022-03-12 ENCOUNTER — Encounter: Payer: Self-pay | Admitting: Obstetrics and Gynecology

## 2022-04-06 DIAGNOSIS — Z419 Encounter for procedure for purposes other than remedying health state, unspecified: Secondary | ICD-10-CM | POA: Diagnosis not present

## 2022-04-18 DIAGNOSIS — Z30430 Encounter for insertion of intrauterine contraceptive device: Secondary | ICD-10-CM | POA: Diagnosis not present

## 2022-04-18 DIAGNOSIS — Z8632 Personal history of gestational diabetes: Secondary | ICD-10-CM | POA: Diagnosis not present

## 2022-04-18 DIAGNOSIS — O924 Hypogalactia: Secondary | ICD-10-CM | POA: Diagnosis not present

## 2022-05-06 DIAGNOSIS — Z419 Encounter for procedure for purposes other than remedying health state, unspecified: Secondary | ICD-10-CM | POA: Diagnosis not present

## 2022-05-10 DIAGNOSIS — Z8632 Personal history of gestational diabetes: Secondary | ICD-10-CM | POA: Diagnosis not present

## 2022-06-06 DIAGNOSIS — Z419 Encounter for procedure for purposes other than remedying health state, unspecified: Secondary | ICD-10-CM | POA: Diagnosis not present

## 2022-06-16 DIAGNOSIS — Z30430 Encounter for insertion of intrauterine contraceptive device: Secondary | ICD-10-CM | POA: Diagnosis not present

## 2022-07-07 DIAGNOSIS — Z419 Encounter for procedure for purposes other than remedying health state, unspecified: Secondary | ICD-10-CM | POA: Diagnosis not present

## 2022-08-06 DIAGNOSIS — Z419 Encounter for procedure for purposes other than remedying health state, unspecified: Secondary | ICD-10-CM | POA: Diagnosis not present

## 2022-08-17 DIAGNOSIS — Z30431 Encounter for routine checking of intrauterine contraceptive device: Secondary | ICD-10-CM | POA: Diagnosis not present

## 2022-09-06 DIAGNOSIS — Z419 Encounter for procedure for purposes other than remedying health state, unspecified: Secondary | ICD-10-CM | POA: Diagnosis not present

## 2022-10-06 DIAGNOSIS — Z419 Encounter for procedure for purposes other than remedying health state, unspecified: Secondary | ICD-10-CM | POA: Diagnosis not present

## 2022-11-06 DIAGNOSIS — Z419 Encounter for procedure for purposes other than remedying health state, unspecified: Secondary | ICD-10-CM | POA: Diagnosis not present

## 2022-12-07 DIAGNOSIS — Z419 Encounter for procedure for purposes other than remedying health state, unspecified: Secondary | ICD-10-CM | POA: Diagnosis not present

## 2023-01-05 DIAGNOSIS — Z419 Encounter for procedure for purposes other than remedying health state, unspecified: Secondary | ICD-10-CM | POA: Diagnosis not present

## 2023-02-05 DIAGNOSIS — Z419 Encounter for procedure for purposes other than remedying health state, unspecified: Secondary | ICD-10-CM | POA: Diagnosis not present

## 2023-03-07 DIAGNOSIS — Z419 Encounter for procedure for purposes other than remedying health state, unspecified: Secondary | ICD-10-CM | POA: Diagnosis not present

## 2023-04-07 DIAGNOSIS — Z419 Encounter for procedure for purposes other than remedying health state, unspecified: Secondary | ICD-10-CM | POA: Diagnosis not present

## 2023-05-07 DIAGNOSIS — Z419 Encounter for procedure for purposes other than remedying health state, unspecified: Secondary | ICD-10-CM | POA: Diagnosis not present

## 2023-06-07 DIAGNOSIS — Z419 Encounter for procedure for purposes other than remedying health state, unspecified: Secondary | ICD-10-CM | POA: Diagnosis not present

## 2023-07-08 DIAGNOSIS — Z419 Encounter for procedure for purposes other than remedying health state, unspecified: Secondary | ICD-10-CM | POA: Diagnosis not present

## 2023-08-07 DIAGNOSIS — Z419 Encounter for procedure for purposes other than remedying health state, unspecified: Secondary | ICD-10-CM | POA: Diagnosis not present

## 2023-09-07 DIAGNOSIS — Z419 Encounter for procedure for purposes other than remedying health state, unspecified: Secondary | ICD-10-CM | POA: Diagnosis not present

## 2023-10-07 DIAGNOSIS — Z419 Encounter for procedure for purposes other than remedying health state, unspecified: Secondary | ICD-10-CM | POA: Diagnosis not present

## 2023-11-07 DIAGNOSIS — Z419 Encounter for procedure for purposes other than remedying health state, unspecified: Secondary | ICD-10-CM | POA: Diagnosis not present

## 2023-11-18 IMAGING — US US OB COMP +14 WK
1 series · 13 of 28 positions shown · non-contrast
Comparison: none

CLINICAL DATA: Second trimester pregnancy for fetal anatomy survey.

EXAM:
OBSTETRICAL ULTRASOUND >14 WKS

[Series 1: us ob comp +14 wk · 0.22mm/px · 13 of 94 slices shown]
[im 4/94]
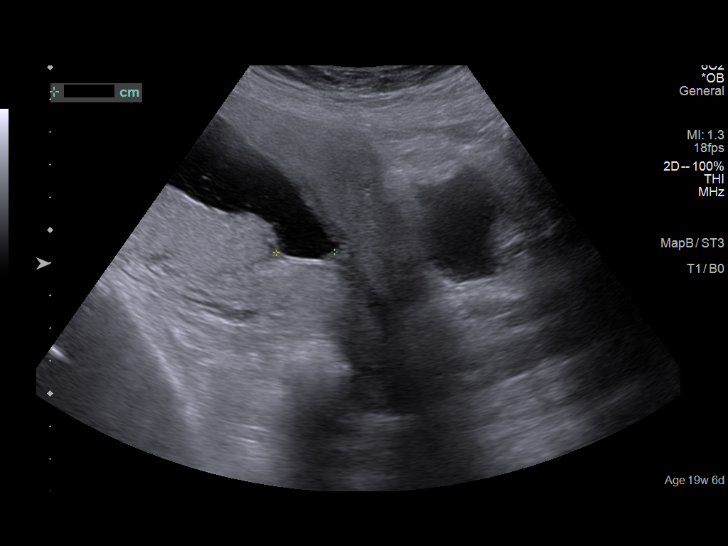
[im 11/94]
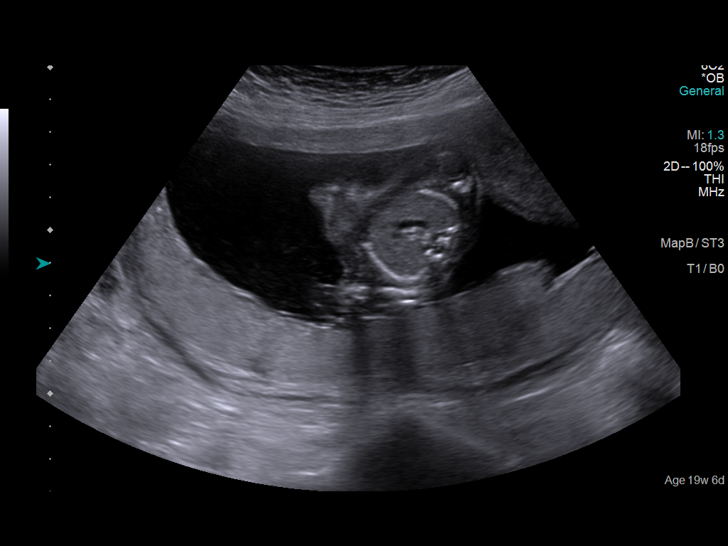
[im 18/94]
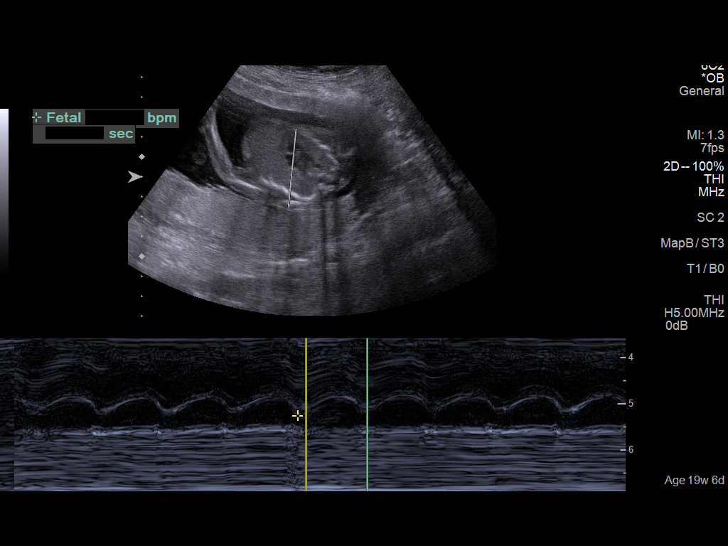
[im 25/94]
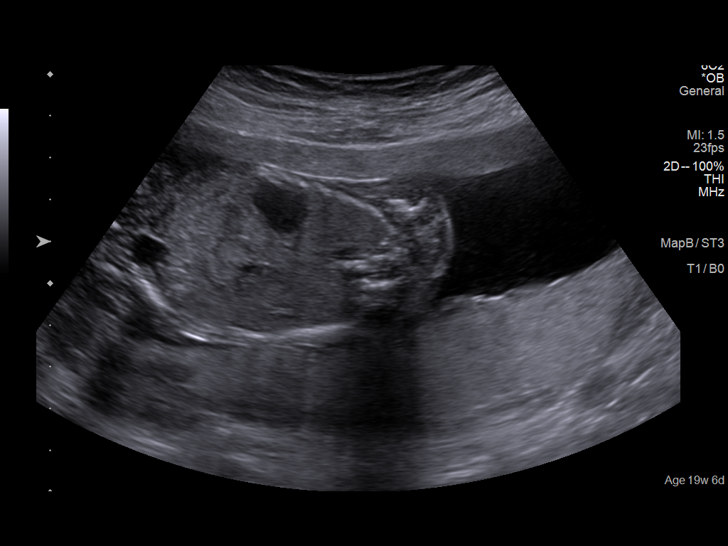
[im 32/94]
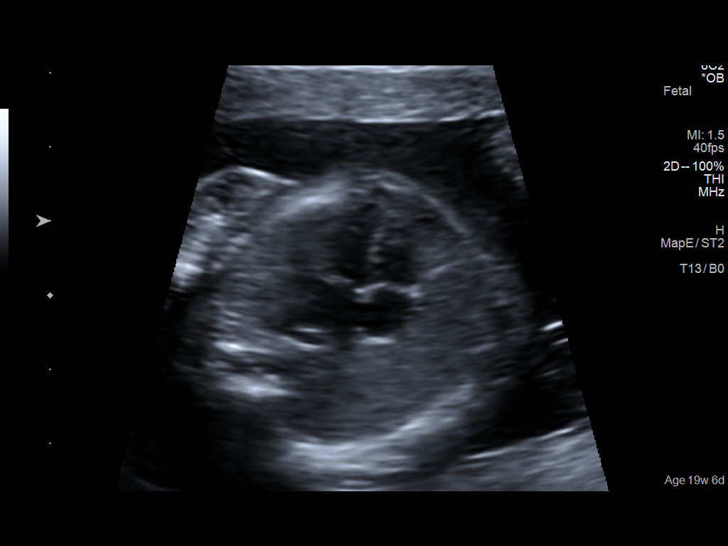
[im 38/94]
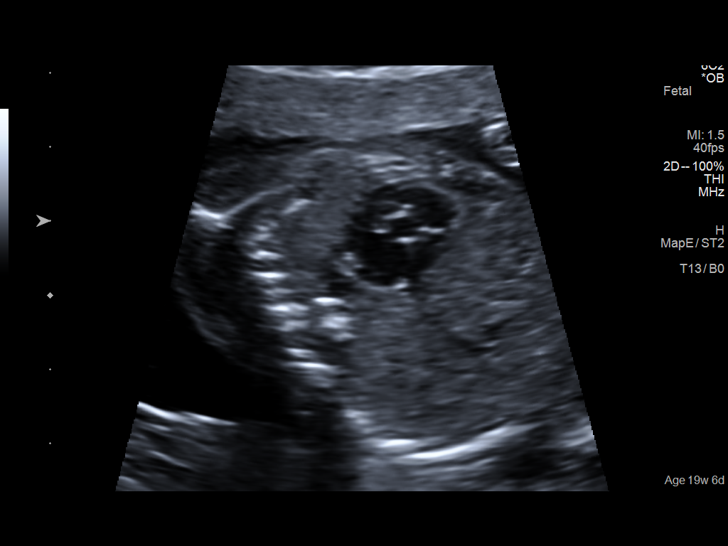
[im 49/94]
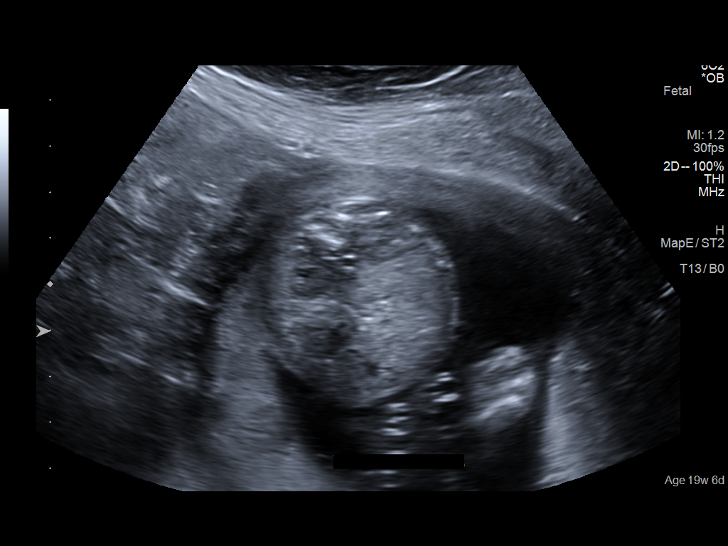
[im 56/94]
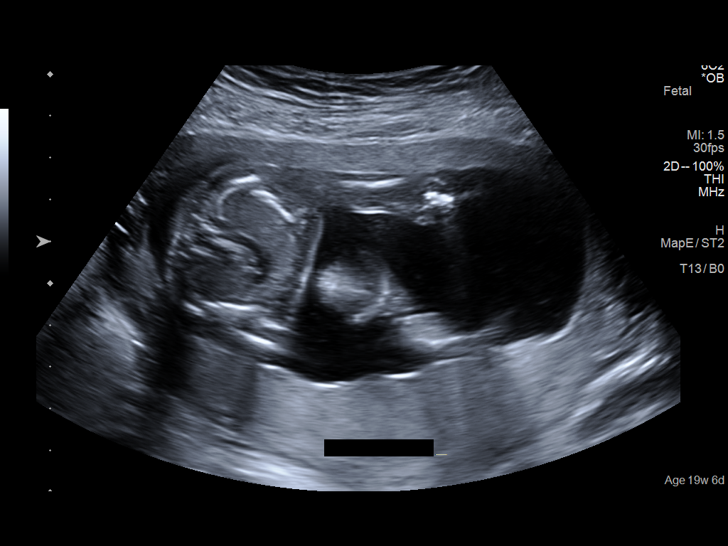
[im 63/94]
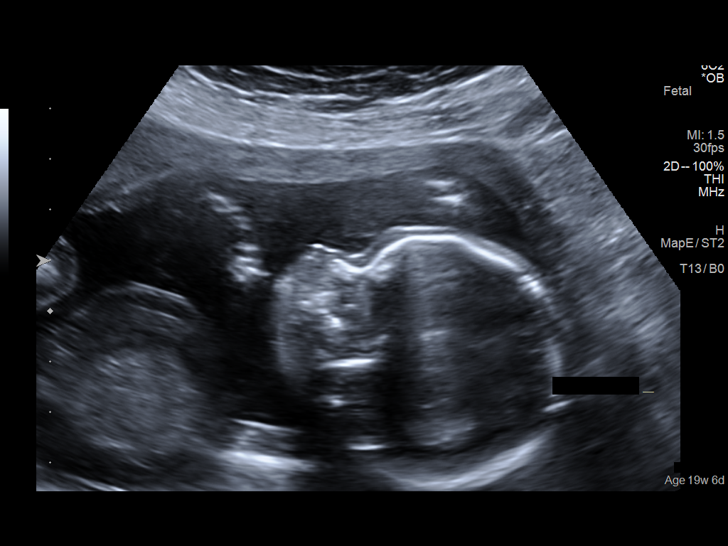
[im 69/94]
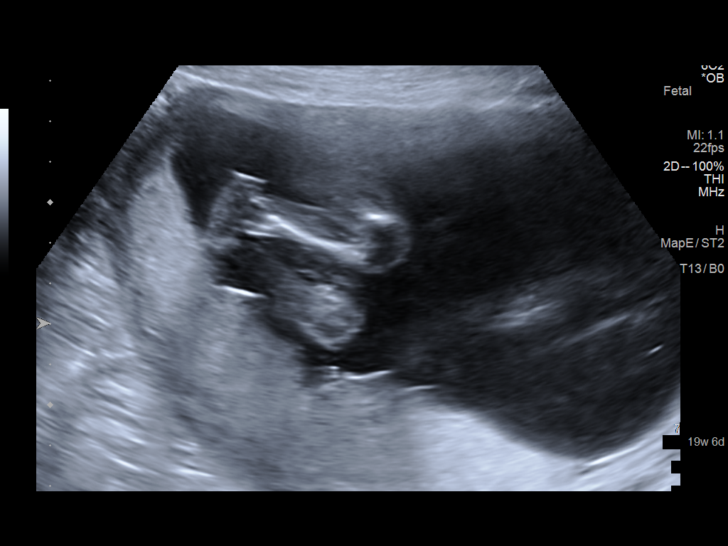
[im 76/94]
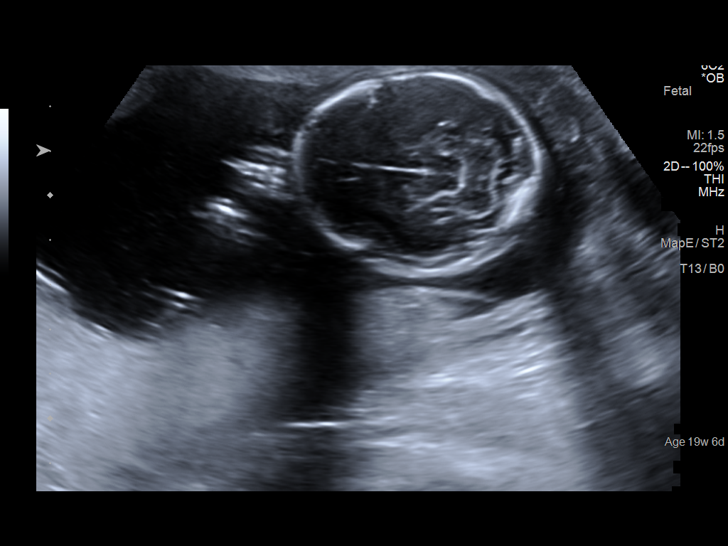
[im 83/94]
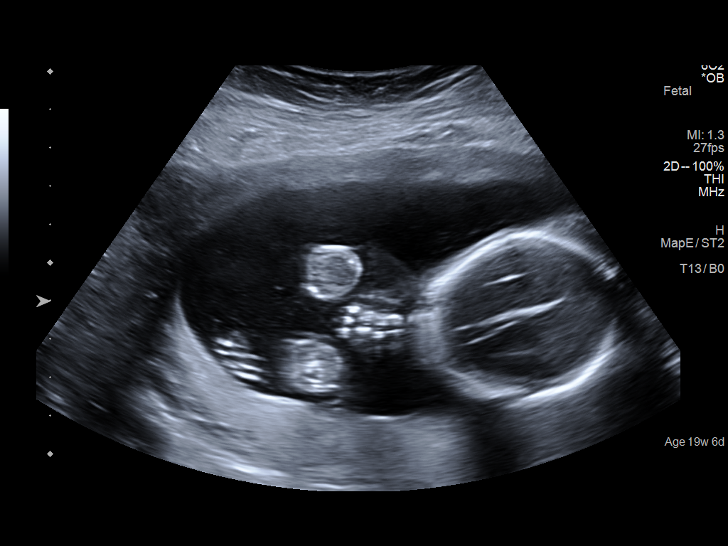
[im 90/94]
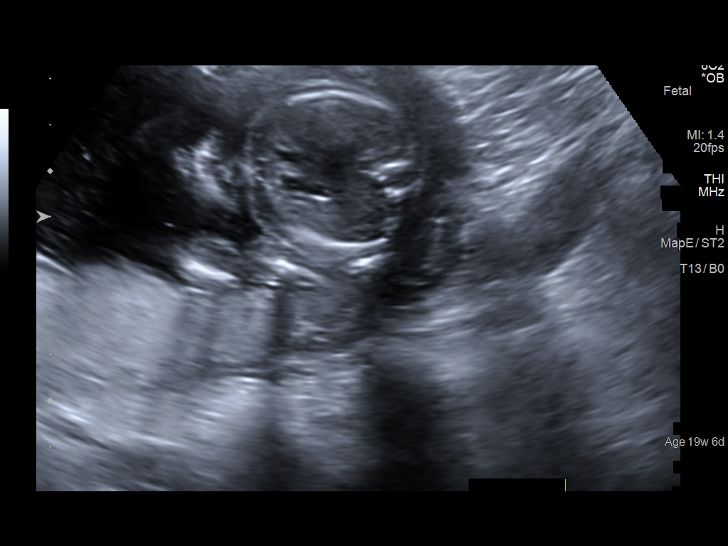

[13 of 28 positions shown; findings below may reference images not displayed]

FINDINGS: Number of Fetuses: 1

Heart Rate:  168 bpm

Movement: Yes

Presentation: Transverse lie with head to maternal left

Previa: No

Placental Location: Posterior

Amniotic Fluid (Subjective): Within normal limits

Amniotic Fluid (Objective):

Vertical pocket = 3.5cm

FETAL BIOMETRY

BPD: 4.5cm 19w 4 d

HC:   16.3cm 19w 2d

AC:   14.4cm 19w 5d

FL:   3.2cm 19w 6d

Current Mean GA: 19w 4d US EDC: 02/23/2022

FETAL ANATOMY

Lateral Ventricles: Appears normal

Thalami/CSP: Appears normal

Posterior Fossa:  Appears normal

Nuchal Region: Appears normal   NFT= 3.1 mm

Upper Lip: Appears normal

Spine: Not visualized

4 Chamber Heart on Left: Appears normal

LVOT: Appears normal

RVOT: Appears normal

Stomach on Left: Appears normal

3 Vessel Cord: Appears normal

Cord Insertion site: Appears normal

Kidneys: Appears normal

Bladder: Appears normal

Extremities: Appears normal

Sex: Male

Technically difficult due to: Fetal position

Maternal Findings:

Cervix:  3.6 cm TA
IMPRESSION: Single living IUP with estimated gestational age of 19 weeks 4 days,
and US EDC of 02/23/2022.

No fetal anomalies identified, although fetal spine could not be
visualized. Consider followup ultrasound in 3-4 weeks to complete
anatomic evaluation.

## 2023-12-08 DIAGNOSIS — Z419 Encounter for procedure for purposes other than remedying health state, unspecified: Secondary | ICD-10-CM | POA: Diagnosis not present

## 2024-01-05 DIAGNOSIS — Z419 Encounter for procedure for purposes other than remedying health state, unspecified: Secondary | ICD-10-CM | POA: Diagnosis not present

## 2024-01-10 DIAGNOSIS — H9202 Otalgia, left ear: Secondary | ICD-10-CM | POA: Diagnosis not present

## 2024-01-10 DIAGNOSIS — H6122 Impacted cerumen, left ear: Secondary | ICD-10-CM | POA: Diagnosis not present

## 2024-01-18 DIAGNOSIS — H6692 Otitis media, unspecified, left ear: Secondary | ICD-10-CM | POA: Diagnosis not present

## 2024-02-16 DIAGNOSIS — Z419 Encounter for procedure for purposes other than remedying health state, unspecified: Secondary | ICD-10-CM | POA: Diagnosis not present

## 2024-03-17 DIAGNOSIS — Z419 Encounter for procedure for purposes other than remedying health state, unspecified: Secondary | ICD-10-CM | POA: Diagnosis not present

## 2024-04-17 DIAGNOSIS — Z419 Encounter for procedure for purposes other than remedying health state, unspecified: Secondary | ICD-10-CM | POA: Diagnosis not present

## 2024-05-17 DIAGNOSIS — Z419 Encounter for procedure for purposes other than remedying health state, unspecified: Secondary | ICD-10-CM | POA: Diagnosis not present

## 2024-06-17 DIAGNOSIS — Z419 Encounter for procedure for purposes other than remedying health state, unspecified: Secondary | ICD-10-CM | POA: Diagnosis not present

## 2024-07-18 DIAGNOSIS — Z419 Encounter for procedure for purposes other than remedying health state, unspecified: Secondary | ICD-10-CM | POA: Diagnosis not present

## 2024-08-31 DIAGNOSIS — H6692 Otitis media, unspecified, left ear: Secondary | ICD-10-CM | POA: Diagnosis not present

## 2024-08-31 DIAGNOSIS — Z20818 Contact with and (suspected) exposure to other bacterial communicable diseases: Secondary | ICD-10-CM | POA: Diagnosis not present

## 2024-08-31 DIAGNOSIS — H9202 Otalgia, left ear: Secondary | ICD-10-CM | POA: Diagnosis not present

## 2024-08-31 DIAGNOSIS — Z20828 Contact with and (suspected) exposure to other viral communicable diseases: Secondary | ICD-10-CM | POA: Diagnosis not present

## 2024-09-18 DIAGNOSIS — H6521 Chronic serous otitis media, right ear: Secondary | ICD-10-CM | POA: Diagnosis not present

## 2024-09-18 DIAGNOSIS — H6992 Unspecified Eustachian tube disorder, left ear: Secondary | ICD-10-CM | POA: Diagnosis not present

## 2024-10-21 DIAGNOSIS — J018 Other acute sinusitis: Secondary | ICD-10-CM | POA: Diagnosis not present

## 2024-10-21 DIAGNOSIS — H6983 Other specified disorders of Eustachian tube, bilateral: Secondary | ICD-10-CM | POA: Diagnosis not present
